# Patient Record
Sex: Female | Born: 1975 | Race: White | Hispanic: No | Marital: Married | Smoking: Current every day smoker
Health system: Southern US, Community
[De-identification: ages and names within clinical notes are randomized; demographics above are authoritative.]

## PROBLEM LIST (undated history)

## (undated) DIAGNOSIS — D693 Immune thrombocytopenic purpura: Secondary | ICD-10-CM

## (undated) HISTORY — PX: LEG SURGERY: SHX1003

---

## 2006-01-08 DIAGNOSIS — F32A Depression, unspecified: Secondary | ICD-10-CM | POA: Insufficient documentation

## 2006-01-08 HISTORY — DX: Depression, unspecified: F32.A

## 2006-10-09 DIAGNOSIS — I472 Ventricular tachycardia, unspecified: Secondary | ICD-10-CM | POA: Insufficient documentation

## 2006-10-09 HISTORY — DX: Ventricular tachycardia, unspecified: I47.20

## 2006-11-03 DIAGNOSIS — R519 Headache, unspecified: Secondary | ICD-10-CM

## 2006-11-03 HISTORY — DX: Headache, unspecified: R51.9

## 2006-12-01 DIAGNOSIS — M109 Gout, unspecified: Secondary | ICD-10-CM

## 2006-12-01 HISTORY — DX: Gout, unspecified: M10.9

## 2008-01-14 DIAGNOSIS — E041 Nontoxic single thyroid nodule: Secondary | ICD-10-CM | POA: Insufficient documentation

## 2008-01-14 HISTORY — DX: Nontoxic single thyroid nodule: E04.1

## 2010-12-18 DIAGNOSIS — E039 Hypothyroidism, unspecified: Secondary | ICD-10-CM | POA: Insufficient documentation

## 2010-12-18 HISTORY — DX: Hypothyroidism, unspecified: E03.9

## 2011-03-15 DIAGNOSIS — R0600 Dyspnea, unspecified: Secondary | ICD-10-CM

## 2011-03-15 DIAGNOSIS — D72829 Elevated white blood cell count, unspecified: Secondary | ICD-10-CM

## 2011-03-15 HISTORY — DX: Dyspnea, unspecified: R06.00

## 2011-03-15 HISTORY — DX: Elevated white blood cell count, unspecified: D72.829

## 2011-03-18 DIAGNOSIS — L709 Acne, unspecified: Secondary | ICD-10-CM

## 2011-03-18 HISTORY — DX: Acne, unspecified: L70.9

## 2016-01-16 DIAGNOSIS — E890 Postprocedural hypothyroidism: Secondary | ICD-10-CM | POA: Diagnosis not present

## 2016-06-05 DIAGNOSIS — E7800 Pure hypercholesterolemia, unspecified: Secondary | ICD-10-CM | POA: Diagnosis not present

## 2016-06-05 DIAGNOSIS — R300 Dysuria: Secondary | ICD-10-CM | POA: Diagnosis not present

## 2016-06-05 DIAGNOSIS — I10 Essential (primary) hypertension: Secondary | ICD-10-CM | POA: Diagnosis not present

## 2016-06-05 DIAGNOSIS — N390 Urinary tract infection, site not specified: Secondary | ICD-10-CM | POA: Diagnosis not present

## 2016-06-05 DIAGNOSIS — E119 Type 2 diabetes mellitus without complications: Secondary | ICD-10-CM | POA: Diagnosis not present

## 2016-06-05 DIAGNOSIS — E039 Hypothyroidism, unspecified: Secondary | ICD-10-CM | POA: Diagnosis not present

## 2016-06-07 ENCOUNTER — Encounter (HOSPITAL_COMMUNITY): Payer: Self-pay | Admitting: Emergency Medicine

## 2016-06-07 ENCOUNTER — Emergency Department (HOSPITAL_COMMUNITY)
Admission: EM | Admit: 2016-06-07 | Discharge: 2016-06-07 | Disposition: A | Discharge status: Home / Self Care | Payer: BLUE CROSS/BLUE SHIELD | Attending: Emergency Medicine | Admitting: Emergency Medicine

## 2016-06-07 DIAGNOSIS — F17200 Nicotine dependence, unspecified, uncomplicated: Secondary | ICD-10-CM | POA: Insufficient documentation

## 2016-06-07 DIAGNOSIS — D696 Thrombocytopenia, unspecified: Secondary | ICD-10-CM | POA: Insufficient documentation

## 2016-06-07 DIAGNOSIS — N3000 Acute cystitis without hematuria: Secondary | ICD-10-CM | POA: Diagnosis not present

## 2016-06-07 DIAGNOSIS — R350 Frequency of micturition: Secondary | ICD-10-CM | POA: Diagnosis present

## 2016-06-07 HISTORY — DX: Immune thrombocytopenic purpura: D69.3

## 2016-06-07 LAB — URINE MICROSCOPIC-ADD ON

## 2016-06-07 LAB — URINALYSIS, ROUTINE W REFLEX MICROSCOPIC
BILIRUBIN URINE: NEGATIVE
GLUCOSE, UA: NEGATIVE mg/dL
Ketones, ur: NEGATIVE mg/dL
Leukocytes, UA: NEGATIVE
Nitrite: NEGATIVE
PROTEIN: NEGATIVE mg/dL
pH: 6.5 (ref 5.0–8.0)

## 2016-06-07 LAB — CBC WITH DIFFERENTIAL/PLATELET
BASOS PCT: 1 %
Basophils Absolute: 0.1 10*3/uL (ref 0.0–0.1)
EOS ABS: 0.3 10*3/uL (ref 0.0–0.7)
EOS PCT: 2 %
HCT: 42.7 % (ref 36.0–46.0)
Hemoglobin: 13.9 g/dL (ref 12.0–15.0)
Lymphocytes Relative: 27 %
Lymphs Abs: 5.6 10*3/uL — ABNORMAL HIGH (ref 0.7–4.0)
MCH: 28.7 pg (ref 26.0–34.0)
MCHC: 32.6 g/dL (ref 30.0–36.0)
MCV: 88 fL (ref 78.0–100.0)
MONO ABS: 1 10*3/uL (ref 0.1–1.0)
MONOS PCT: 5 %
Neutro Abs: 13.7 10*3/uL — ABNORMAL HIGH (ref 1.7–7.7)
Neutrophils Relative %: 66 %
Platelets: 59 10*3/uL — ABNORMAL LOW (ref 150–400)
RBC: 4.85 MIL/uL (ref 3.87–5.11)
RDW: 15 % (ref 11.5–15.5)
WBC: 20.8 10*3/uL — ABNORMAL HIGH (ref 4.0–10.5)

## 2016-06-07 LAB — COMPREHENSIVE METABOLIC PANEL
ALK PHOS: 81 U/L (ref 38–126)
ALT: 18 U/L (ref 14–54)
AST: 28 U/L (ref 15–41)
Albumin: 3.8 g/dL (ref 3.5–5.0)
Anion gap: 11 (ref 5–15)
CALCIUM: 8.7 mg/dL — AB (ref 8.9–10.3)
CHLORIDE: 102 mmol/L (ref 101–111)
CO2: 23 mmol/L (ref 22–32)
CREATININE: 0.98 mg/dL (ref 0.44–1.00)
Glucose, Bld: 165 mg/dL — ABNORMAL HIGH (ref 65–99)
Potassium: 4 mmol/L (ref 3.5–5.1)
SODIUM: 136 mmol/L (ref 135–145)
Total Bilirubin: 0.7 mg/dL (ref 0.3–1.2)
Total Protein: 6.9 g/dL (ref 6.5–8.1)

## 2016-06-07 LAB — PREGNANCY, URINE: PREG TEST UR: NEGATIVE

## 2016-06-07 LAB — TSH: TSH: 9.973 u[IU]/mL — ABNORMAL HIGH (ref 0.350–4.500)

## 2016-06-07 LAB — T4, FREE: Free T4: 1.02 ng/dL (ref 0.61–1.12)

## 2016-06-07 MED ORDER — IBUPROFEN 400 MG PO TABS: 400.0000 mg | ORAL_TABLET | Freq: Once | ORAL | Status: DC

## 2016-06-07 MED ORDER — IBUPROFEN 400 MG PO TABS
ORAL_TABLET | ORAL | Status: AC
  Filled 2016-06-07: qty 1

## 2016-06-07 MED ORDER — CEPHALEXIN 500 MG PO CAPS: 500.0000 mg | ORAL_CAPSULE | Freq: Two times a day (BID) | ORAL | 0 refills | Status: DC

## 2016-06-07 MED ORDER — CEPHALEXIN 500 MG PO CAPS: 500.0000 mg | ORAL_CAPSULE | Freq: Two times a day (BID) | ORAL | 0 refills | Status: AC

## 2016-06-07 NOTE — ED Triage Notes (Signed)
Pt presents to ED for assessment of several abdnormal labs called from a "regular checkup" appointment with her PCP.  Pt was notified her platelets were "very low" with a history of ITP, spleen removal, and thyroid removal.  Pt also had "off the charts high" thyroid levels.  C/o increased fatigue recently, but denies any other noticeable changes.

## 2016-06-07 NOTE — ED Provider Notes (Signed)
[facility]-EMERGENCY DEPT Provider Note  CSN: [id number] Arrival Date & Time: 06/07/16 @ 1159  History    Chief Complaint Chief Complaint  Patient presents with  . Abnormal Lab    HPI Latoya Ayers is a 40 y.o. female.  Patient presents the ED for concern of low platelet count upon lab collection on Wednesday due to urinary discomfort and increased urinary frequency. Was diagnosed w/ UTI and given rocephin shot and course bactrim and is on the 3rd day. Denies hematuria but endorses continued urinary discomfort.  Patient endorses she had period 2 weeks previous and had heavy bleeding however endorses that she believe it may be related to kratrum herbal supplement that she had been taking for the past 2 months for fatigue over the past 4 months.  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  Past Medical & Surgical History    Past Medical History:  Diagnosis Date  . Acute ITP (HCC)    There are no active problems to display for this patient.  Past Surgical History:  Procedure Laterality Date  . LEG SURGERY      Family & Social History    History reviewed. No pertinent family history. Social History  Substance Use Topics  . Smoking status: Current Every Day Smoker    Packs/day: 1.00  . Smokeless tobacco: Never Used  . Alcohol use Yes     Comment: socially    Home Medications    Prior to Admission medications   Not on File    Allergies    Penicillins  I reviewed & agree with nursing's documentation on the patient's past medical, surgical, social & family histories as well as their allergies.  Review of Systems  Complete ROS obtained, and is negative except as stated in HPI.  Physical Exam  Updated Vital Signs BP 114/63 (BP Location: Left Arm)   Pulse 76   Temp 98.4 F (36.9 C) (Oral)   Resp 16   Ht 5\' 4"  (1.626 m)   Wt 127 kg   LMP 05/24/2016   SpO2 99%   BMI 48.06 kg/m  I have reviewed the triage  vital signs and the nursing notes. Physical Exam CONST: Patient alert, well appearing, in no apparent distress.  EYES: PERRLA. EOMI. Conjunctiva w/o d/c. Lids AT w/o swelling.  ENMT: External Nares & Ears AT w/o swelling. Oropharynx patent. MM moist w/o petechiae.  NECK: ROM full w/o rigidity. Trachea midline. JVD absent.  CVS: +S1/S2 w/o obvious murmur. Lower extremities w/o pitting edema.  RESP: Respiratory effort unlabored w/o retractions & accessory muscle use. BS clear bilaterally.  GI: Soft & ND. +BS x 4. TTP absent. Hernia absent. Guarding & Rebound absent.  BACK: CVA TTP absent bilaterally.  SKIN: Skin warm & dry. Turgor good. No rash. No petechiae or purpura.  PSYCH: Alert. Oriented. Affect and mood appropriate.  NEURO: CN II-XII grossly intact. Motor exam symmetric w/ upper & lower extremities 5/5 bilaterally. Sensation grossly intact.  MSK: Joints located & stable, w/o obvious dislocation & obvious deformity or crepitus absent w/ Cap refill < 2 sec. Peripheral pulses 2+ & equal in all extremities.    ED Treatments & Results   Labs (only abnormal results are displayed) Labs Reviewed  CBC WITH DIFFERENTIAL/PLATELET - Abnormal; Notable for the following:       Result Value   WBC 20.8 (*)    Platelets 59 (*)    Neutro Abs 13.7 (*)  Lymphs Abs 5.6 (*)    All other components within normal limits  COMPREHENSIVE METABOLIC PANEL - Abnormal; Notable for the following:    Glucose, Bld 165 (*)    BUN <5 (*)    Calcium 8.7 (*)    All other components within normal limits    EKG    EKG Interpretation  Date/Time:    Ventricular Rate:    PR Interval:    QRS Duration:   QT Interval:    QTC Calculation:   R Axis:     Text Interpretation:         Radiology No results found.  Pertinent labs & imaging results that were available during my care of the patient were independently visualized by me and considered in my medical decision making, please see chart for  details.  Procedures (including critical care time) Procedures  Medications Ordered in ED Medications  ibuprofen (ADVIL,MOTRIN) tablet 400 mg (400 mg Oral Not Given 06/07/16 1234)  ibuprofen (ADVIL,MOTRIN) 400 MG tablet (not administered)    Initial Impression & Plan / ED Course & Results / Final Disposition   Initial Impression & Plan Patient presents to emergency department for assessment of low platelets and continued urinary discomfort. Patient is well-appearing and nontoxic afebrile and has no criteria for sepsis at this time and also denies flank pain that would concern for pyelonephritis therefore I obtained a repeat urine and laboratory work and obtained consultation from hematology oncology and discussed patient's previous history of ITP and platelets of 59. Per discussion with them they request no further treatment at this time and requests patient be seen in the clinic in the next 1-2 weeks. They state she requires no steroid treatment at this time. As patient has no signs of bleeding and laboratory work reveals no other concerning signs of hematologic compromise and urine shows no evidence of microscopic bleeding believe patient is safe for evaluation of low platelets as outpatient. Given patient still has continued urination symptoms and urine remains with bacteria will transfer patient to 10 day course of cephalexin. Urine pregnancy test negative. Due to patient's endorsement of previously elevated TSH I obtained a repeat levels at this time and appreciate the patient is significantly hypothyroid and levels of TSH are 9.973 with normal T4. Discussion had with patient the patient requires close primary care follow-up for titration of levothyroxine as this is unsafe for me to do so at this time. Patient has no altered mental status and no symptoms concerning for Myedema coma.   Final Disposition Reassessment of the patient reveals no acute concerns. Resting comfortably in bed.  ED  Course in its entirety, care plan & clinical impressions w/ associated risks were reviewed w/ the patient and spouse/SO. In light of the patient's reassuring evaluation above, I consider discharge disposition reasonable. They are in agreement. I gave my typical, strict return precautions in simple, non-technical language. We also discussed symptoms that are most concerning & would necessitate emergent return. I explicitly told them to immediately return to the ED if new symptoms develop, if worse, or for ANY concern. Treatments & follow up plan agreed upon & I confirmed all concerns & questions were addressed prior to discharge.  Follow Up [facility] EMERGENCY DEPARTMENT [address] [id number] mc [place] [address] [phone] Go to  FOR ANY CONCERNS OR IF [facility] AND WELLNESS [address] [place] [address] [phone] Go in 2 days to establish Primary Care  Malachy MoodYan Feng, MD [address] [place] [id number] [phone]  Schedule an appointment as soon as possible for a visit in 1 day for hematology follow up  [facility] [address] [id number] mc [place] [id number] [phone] Go to  for any bleeding symptoms return immediately   New Prescriptions Current Discharge Medication List    START taking these medications   Details  cephALEXin (KEFLEX) 500 MG capsule Take 1 capsule (500 mg total) by mouth 2 (two) times daily. Qty: 20 capsule, Refills: 0       Final Clinical Impression & ED Diagnoses  No diagnosis found. Patient care discussed with the attending physician, Dr. Patria Maneampos, who oversaw their evaluation & treatment & voiced agreement.  Note: This document was prepared using Dragon voice recognition software and may include unintentional dictation errors.  House Officer: Jonette EvaBrad Asami Lambright, MD, Emergency Medicine  Resident.   Jonette EvaBrad Omarie Parcell, MD 06/07/16 2117    Azalia BilisKevin Campos, MD 06/08/16 [phone]

## 2016-06-07 NOTE — ED Notes (Signed)
Urine sample sent to lab

## 2016-06-07 NOTE — ED Notes (Signed)
EDP at bedside  

## 2016-06-08 LAB — URINE CULTURE

## 2016-06-10 DIAGNOSIS — R799 Abnormal finding of blood chemistry, unspecified: Secondary | ICD-10-CM | POA: Diagnosis not present

## 2016-06-10 DIAGNOSIS — N939 Abnormal uterine and vaginal bleeding, unspecified: Secondary | ICD-10-CM | POA: Diagnosis not present

## 2016-06-10 DIAGNOSIS — D693 Immune thrombocytopenic purpura: Secondary | ICD-10-CM | POA: Diagnosis not present

## 2016-06-10 DIAGNOSIS — D696 Thrombocytopenia, unspecified: Secondary | ICD-10-CM | POA: Diagnosis not present

## 2016-06-10 DIAGNOSIS — F17210 Nicotine dependence, cigarettes, uncomplicated: Secondary | ICD-10-CM | POA: Diagnosis not present

## 2016-06-10 DIAGNOSIS — R21 Rash and other nonspecific skin eruption: Secondary | ICD-10-CM | POA: Diagnosis not present

## 2016-06-10 DIAGNOSIS — Z6841 Body Mass Index (BMI) 40.0 and over, adult: Secondary | ICD-10-CM | POA: Diagnosis not present

## 2016-06-25 DIAGNOSIS — R5383 Other fatigue: Secondary | ICD-10-CM | POA: Diagnosis not present

## 2016-06-25 DIAGNOSIS — E669 Obesity, unspecified: Secondary | ICD-10-CM | POA: Diagnosis not present

## 2016-06-25 DIAGNOSIS — R109 Unspecified abdominal pain: Secondary | ICD-10-CM | POA: Diagnosis not present

## 2016-06-25 DIAGNOSIS — E039 Hypothyroidism, unspecified: Secondary | ICD-10-CM | POA: Diagnosis not present

## 2016-06-25 DIAGNOSIS — F17200 Nicotine dependence, unspecified, uncomplicated: Secondary | ICD-10-CM | POA: Diagnosis not present

## 2016-06-25 DIAGNOSIS — Z720 Tobacco use: Secondary | ICD-10-CM | POA: Diagnosis not present

## 2016-06-25 DIAGNOSIS — G4730 Sleep apnea, unspecified: Secondary | ICD-10-CM | POA: Diagnosis not present

## 2016-07-03 DIAGNOSIS — R079 Chest pain, unspecified: Secondary | ICD-10-CM | POA: Diagnosis not present

## 2016-07-03 DIAGNOSIS — E039 Hypothyroidism, unspecified: Secondary | ICD-10-CM | POA: Diagnosis not present

## 2016-07-03 DIAGNOSIS — D693 Immune thrombocytopenic purpura: Secondary | ICD-10-CM | POA: Diagnosis not present

## 2016-07-03 DIAGNOSIS — R42 Dizziness and giddiness: Secondary | ICD-10-CM | POA: Diagnosis not present

## 2016-07-03 DIAGNOSIS — Z136 Encounter for screening for cardiovascular disorders: Secondary | ICD-10-CM | POA: Diagnosis not present

## 2016-07-03 DIAGNOSIS — R0600 Dyspnea, unspecified: Secondary | ICD-10-CM | POA: Diagnosis not present

## 2016-07-03 DIAGNOSIS — R011 Cardiac murmur, unspecified: Secondary | ICD-10-CM | POA: Diagnosis not present

## 2016-07-16 DIAGNOSIS — D693 Immune thrombocytopenic purpura: Secondary | ICD-10-CM | POA: Insufficient documentation

## 2016-07-18 DIAGNOSIS — R51 Headache: Secondary | ICD-10-CM | POA: Diagnosis not present

## 2016-07-18 DIAGNOSIS — D691 Qualitative platelet defects: Secondary | ICD-10-CM | POA: Diagnosis not present

## 2016-07-18 DIAGNOSIS — Z85850 Personal history of malignant neoplasm of thyroid: Secondary | ICD-10-CM | POA: Diagnosis not present

## 2016-07-18 DIAGNOSIS — E890 Postprocedural hypothyroidism: Secondary | ICD-10-CM | POA: Diagnosis not present

## 2016-07-18 DIAGNOSIS — D721 Eosinophilia: Secondary | ICD-10-CM | POA: Diagnosis not present

## 2016-07-18 DIAGNOSIS — G8929 Other chronic pain: Secondary | ICD-10-CM | POA: Diagnosis not present

## 2016-07-18 DIAGNOSIS — F17210 Nicotine dependence, cigarettes, uncomplicated: Secondary | ICD-10-CM | POA: Diagnosis not present

## 2016-07-18 DIAGNOSIS — D696 Thrombocytopenia, unspecified: Secondary | ICD-10-CM | POA: Diagnosis not present

## 2016-07-18 DIAGNOSIS — E039 Hypothyroidism, unspecified: Secondary | ICD-10-CM | POA: Diagnosis not present

## 2016-07-18 DIAGNOSIS — Z6841 Body Mass Index (BMI) 40.0 and over, adult: Secondary | ICD-10-CM | POA: Diagnosis not present

## 2016-07-18 DIAGNOSIS — Z23 Encounter for immunization: Secondary | ICD-10-CM | POA: Diagnosis not present

## 2016-07-18 DIAGNOSIS — Z79899 Other long term (current) drug therapy: Secondary | ICD-10-CM | POA: Diagnosis not present

## 2016-07-31 DIAGNOSIS — L509 Urticaria, unspecified: Secondary | ICD-10-CM | POA: Diagnosis not present

## 2016-07-31 DIAGNOSIS — D696 Thrombocytopenia, unspecified: Secondary | ICD-10-CM | POA: Diagnosis not present

## 2016-08-07 DIAGNOSIS — D696 Thrombocytopenia, unspecified: Secondary | ICD-10-CM | POA: Diagnosis not present

## 2016-08-07 DIAGNOSIS — C73 Malignant neoplasm of thyroid gland: Secondary | ICD-10-CM

## 2016-08-07 DIAGNOSIS — D72829 Elevated white blood cell count, unspecified: Secondary | ICD-10-CM | POA: Diagnosis not present

## 2016-08-07 DIAGNOSIS — Z6841 Body Mass Index (BMI) 40.0 and over, adult: Secondary | ICD-10-CM | POA: Diagnosis not present

## 2016-08-07 HISTORY — DX: Malignant neoplasm of thyroid gland: C73

## 2016-10-10 DIAGNOSIS — R0600 Dyspnea, unspecified: Secondary | ICD-10-CM | POA: Diagnosis not present

## 2016-10-10 DIAGNOSIS — E7800 Pure hypercholesterolemia, unspecified: Secondary | ICD-10-CM | POA: Diagnosis not present

## 2016-10-10 DIAGNOSIS — R5383 Other fatigue: Secondary | ICD-10-CM | POA: Diagnosis not present

## 2016-10-10 DIAGNOSIS — E559 Vitamin D deficiency, unspecified: Secondary | ICD-10-CM | POA: Diagnosis not present

## 2016-10-10 DIAGNOSIS — F17200 Nicotine dependence, unspecified, uncomplicated: Secondary | ICD-10-CM | POA: Diagnosis not present

## 2016-10-10 DIAGNOSIS — D693 Immune thrombocytopenic purpura: Secondary | ICD-10-CM | POA: Diagnosis not present

## 2016-10-10 DIAGNOSIS — E039 Hypothyroidism, unspecified: Secondary | ICD-10-CM | POA: Diagnosis not present

## 2016-10-10 DIAGNOSIS — R42 Dizziness and giddiness: Secondary | ICD-10-CM | POA: Diagnosis not present

## 2017-01-09 DIAGNOSIS — E039 Hypothyroidism, unspecified: Secondary | ICD-10-CM | POA: Diagnosis not present

## 2017-01-09 DIAGNOSIS — R42 Dizziness and giddiness: Secondary | ICD-10-CM | POA: Diagnosis not present

## 2017-01-09 DIAGNOSIS — D693 Immune thrombocytopenic purpura: Secondary | ICD-10-CM | POA: Diagnosis not present

## 2017-01-09 DIAGNOSIS — E7800 Pure hypercholesterolemia, unspecified: Secondary | ICD-10-CM | POA: Diagnosis not present

## 2017-01-09 DIAGNOSIS — R0989 Other specified symptoms and signs involving the circulatory and respiratory systems: Secondary | ICD-10-CM | POA: Diagnosis not present

## 2017-01-09 DIAGNOSIS — Z22322 Carrier or suspected carrier of Methicillin resistant Staphylococcus aureus: Secondary | ICD-10-CM | POA: Diagnosis not present

## 2017-01-09 DIAGNOSIS — R0600 Dyspnea, unspecified: Secondary | ICD-10-CM | POA: Diagnosis not present

## 2017-01-09 DIAGNOSIS — F17200 Nicotine dependence, unspecified, uncomplicated: Secondary | ICD-10-CM | POA: Diagnosis not present

## 2017-01-09 DIAGNOSIS — Z87891 Personal history of nicotine dependence: Secondary | ICD-10-CM | POA: Diagnosis not present

## 2017-01-09 DIAGNOSIS — R55 Syncope and collapse: Secondary | ICD-10-CM | POA: Diagnosis not present

## 2017-01-09 DIAGNOSIS — L0390 Cellulitis, unspecified: Secondary | ICD-10-CM | POA: Diagnosis not present

## 2017-01-09 DIAGNOSIS — R5383 Other fatigue: Secondary | ICD-10-CM | POA: Diagnosis not present

## 2017-01-09 DIAGNOSIS — E559 Vitamin D deficiency, unspecified: Secondary | ICD-10-CM | POA: Diagnosis not present

## 2017-01-21 DIAGNOSIS — D693 Immune thrombocytopenic purpura: Secondary | ICD-10-CM | POA: Diagnosis not present

## 2017-02-07 DIAGNOSIS — D693 Immune thrombocytopenic purpura: Secondary | ICD-10-CM | POA: Diagnosis not present

## 2017-02-26 DIAGNOSIS — N390 Urinary tract infection, site not specified: Secondary | ICD-10-CM | POA: Diagnosis not present

## 2017-02-26 DIAGNOSIS — R42 Dizziness and giddiness: Secondary | ICD-10-CM | POA: Diagnosis not present

## 2017-02-26 DIAGNOSIS — E039 Hypothyroidism, unspecified: Secondary | ICD-10-CM | POA: Diagnosis not present

## 2017-02-26 DIAGNOSIS — D693 Immune thrombocytopenic purpura: Secondary | ICD-10-CM | POA: Diagnosis not present

## 2017-02-26 DIAGNOSIS — I340 Nonrheumatic mitral (valve) insufficiency: Secondary | ICD-10-CM | POA: Diagnosis not present

## 2017-02-26 DIAGNOSIS — R079 Chest pain, unspecified: Secondary | ICD-10-CM | POA: Diagnosis not present

## 2017-04-18 DIAGNOSIS — L821 Other seborrheic keratosis: Secondary | ICD-10-CM | POA: Diagnosis not present

## 2017-04-18 DIAGNOSIS — L700 Acne vulgaris: Secondary | ICD-10-CM | POA: Diagnosis not present

## 2017-04-18 DIAGNOSIS — L732 Hidradenitis suppurativa: Secondary | ICD-10-CM | POA: Diagnosis not present

## 2017-09-29 DIAGNOSIS — D1801 Hemangioma of skin and subcutaneous tissue: Secondary | ICD-10-CM | POA: Diagnosis not present

## 2017-09-29 DIAGNOSIS — L578 Other skin changes due to chronic exposure to nonionizing radiation: Secondary | ICD-10-CM | POA: Diagnosis not present

## 2017-09-29 DIAGNOSIS — L700 Acne vulgaris: Secondary | ICD-10-CM | POA: Diagnosis not present

## 2017-10-23 DIAGNOSIS — E7800 Pure hypercholesterolemia, unspecified: Secondary | ICD-10-CM | POA: Diagnosis not present

## 2017-10-23 DIAGNOSIS — E039 Hypothyroidism, unspecified: Secondary | ICD-10-CM | POA: Diagnosis not present

## 2017-10-23 DIAGNOSIS — E669 Obesity, unspecified: Secondary | ICD-10-CM | POA: Diagnosis not present

## 2017-10-23 DIAGNOSIS — R5383 Other fatigue: Secondary | ICD-10-CM | POA: Diagnosis not present

## 2017-10-23 DIAGNOSIS — E559 Vitamin D deficiency, unspecified: Secondary | ICD-10-CM | POA: Diagnosis not present

## 2017-12-04 DIAGNOSIS — Z9049 Acquired absence of other specified parts of digestive tract: Secondary | ICD-10-CM | POA: Diagnosis not present

## 2017-12-04 DIAGNOSIS — F1111 Opioid abuse, in remission: Secondary | ICD-10-CM

## 2017-12-04 DIAGNOSIS — Z880 Allergy status to penicillin: Secondary | ICD-10-CM | POA: Diagnosis not present

## 2017-12-04 DIAGNOSIS — J069 Acute upper respiratory infection, unspecified: Secondary | ICD-10-CM | POA: Diagnosis not present

## 2017-12-04 DIAGNOSIS — D693 Immune thrombocytopenic purpura: Secondary | ICD-10-CM | POA: Diagnosis not present

## 2017-12-04 DIAGNOSIS — E890 Postprocedural hypothyroidism: Secondary | ICD-10-CM | POA: Diagnosis not present

## 2017-12-04 DIAGNOSIS — D72829 Elevated white blood cell count, unspecified: Secondary | ICD-10-CM | POA: Diagnosis not present

## 2017-12-04 DIAGNOSIS — E663 Overweight: Secondary | ICD-10-CM | POA: Diagnosis not present

## 2017-12-04 DIAGNOSIS — F17210 Nicotine dependence, cigarettes, uncomplicated: Secondary | ICD-10-CM | POA: Diagnosis not present

## 2017-12-04 DIAGNOSIS — Z79899 Other long term (current) drug therapy: Secondary | ICD-10-CM | POA: Diagnosis not present

## 2017-12-04 DIAGNOSIS — Z87898 Personal history of other specified conditions: Secondary | ICD-10-CM | POA: Diagnosis not present

## 2017-12-04 DIAGNOSIS — N920 Excessive and frequent menstruation with regular cycle: Secondary | ICD-10-CM | POA: Diagnosis not present

## 2017-12-04 HISTORY — DX: Opioid abuse, in remission: F11.11

## 2018-10-29 DIAGNOSIS — J111 Influenza due to unidentified influenza virus with other respiratory manifestations: Secondary | ICD-10-CM | POA: Insufficient documentation

## 2019-09-09 DIAGNOSIS — E039 Hypothyroidism, unspecified: Secondary | ICD-10-CM | POA: Diagnosis not present

## 2019-09-09 DIAGNOSIS — F329 Major depressive disorder, single episode, unspecified: Secondary | ICD-10-CM | POA: Diagnosis not present

## 2019-09-09 DIAGNOSIS — Z1331 Encounter for screening for depression: Secondary | ICD-10-CM | POA: Diagnosis not present

## 2019-09-09 DIAGNOSIS — Z23 Encounter for immunization: Secondary | ICD-10-CM | POA: Diagnosis not present

## 2019-09-09 DIAGNOSIS — Z6841 Body Mass Index (BMI) 40.0 and over, adult: Secondary | ICD-10-CM | POA: Diagnosis not present

## 2019-12-09 DIAGNOSIS — M2550 Pain in unspecified joint: Secondary | ICD-10-CM | POA: Diagnosis not present

## 2019-12-09 DIAGNOSIS — E039 Hypothyroidism, unspecified: Secondary | ICD-10-CM | POA: Diagnosis not present

## 2019-12-09 DIAGNOSIS — R5383 Other fatigue: Secondary | ICD-10-CM | POA: Diagnosis not present

## 2019-12-09 DIAGNOSIS — M791 Myalgia, unspecified site: Secondary | ICD-10-CM | POA: Diagnosis not present

## 2019-12-14 DIAGNOSIS — G4733 Obstructive sleep apnea (adult) (pediatric): Secondary | ICD-10-CM | POA: Diagnosis not present

## 2019-12-14 DIAGNOSIS — R0602 Shortness of breath: Secondary | ICD-10-CM | POA: Diagnosis not present

## 2019-12-15 DIAGNOSIS — R0602 Shortness of breath: Secondary | ICD-10-CM | POA: Diagnosis not present

## 2019-12-15 DIAGNOSIS — G4733 Obstructive sleep apnea (adult) (pediatric): Secondary | ICD-10-CM | POA: Diagnosis not present

## 2020-06-12 DIAGNOSIS — F1120 Opioid dependence, uncomplicated: Secondary | ICD-10-CM | POA: Diagnosis not present

## 2020-06-12 DIAGNOSIS — F1920 Other psychoactive substance dependence, uncomplicated: Secondary | ICD-10-CM | POA: Diagnosis not present

## 2020-06-12 DIAGNOSIS — F19980 Other psychoactive substance use, unspecified with psychoactive substance-induced anxiety disorder: Secondary | ICD-10-CM | POA: Diagnosis not present

## 2020-06-26 DIAGNOSIS — F1920 Other psychoactive substance dependence, uncomplicated: Secondary | ICD-10-CM | POA: Diagnosis not present

## 2020-06-26 DIAGNOSIS — F1120 Opioid dependence, uncomplicated: Secondary | ICD-10-CM | POA: Diagnosis not present

## 2020-06-26 DIAGNOSIS — F19980 Other psychoactive substance use, unspecified with psychoactive substance-induced anxiety disorder: Secondary | ICD-10-CM | POA: Diagnosis not present

## 2020-07-10 DIAGNOSIS — F19980 Other psychoactive substance use, unspecified with psychoactive substance-induced anxiety disorder: Secondary | ICD-10-CM | POA: Diagnosis not present

## 2020-07-10 DIAGNOSIS — F1920 Other psychoactive substance dependence, uncomplicated: Secondary | ICD-10-CM | POA: Diagnosis not present

## 2020-07-10 DIAGNOSIS — F1120 Opioid dependence, uncomplicated: Secondary | ICD-10-CM | POA: Diagnosis not present

## 2020-07-24 DIAGNOSIS — F1120 Opioid dependence, uncomplicated: Secondary | ICD-10-CM | POA: Diagnosis not present

## 2020-07-24 DIAGNOSIS — F1920 Other psychoactive substance dependence, uncomplicated: Secondary | ICD-10-CM | POA: Diagnosis not present

## 2020-07-24 DIAGNOSIS — F19980 Other psychoactive substance use, unspecified with psychoactive substance-induced anxiety disorder: Secondary | ICD-10-CM | POA: Diagnosis not present

## 2020-10-17 ENCOUNTER — Telehealth: Payer: Self-pay | Admitting: Hematology

## 2020-10-17 NOTE — Telephone Encounter (Signed)
Received a call from Ms. Kirt who is wanting to transfer her care from [facility] for chronic ITP. Pt has been scheduled to see Dr. Candise Che on 2/21 at 11am. Pt aware to arrive 30 minutes early.

## 2020-10-22 NOTE — Progress Notes (Signed)
HEMATOLOGY/ONCOLOGY CONSULTATION NOTE  Date of Service: 10/22/2020  Patient Care Team: Patient, No Pcp Per as PCP - General (General Practice) Dr. Simone Curia - [facility]  CHIEF COMPLAINTS/PURPOSE OF CONSULTATION:  Chronic ITP  HISTORY OF PRESENTING ILLNESS:   Latoya Ayers is a wonderful 45 y.o. female who has been referred to [place] by [facility] for transfer of care and for evaluation and management of chronic ITP.   Patient has a history of chronic ITP. She was diagnosed in 2008 and responded to steroids but relapsed with a taper. 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  Patient relapsed for the first time in late 2017 with platelets down to the 30k range and responded with improvement in platelet counts to more than 200k with dexamethasone. She has apparently had multiple treatments with dexamethasone in the last few years without durable response.  She underwent treatment with 4 doses of Rituxan from 3/18 through 12/07/2018 and was in remission for her chronic ITP till recently.  Patient presented to the emergency room at [facility] on 10/06/[facility] [place] with 2 days of heavy menstrual vaginal bleeding, headaches, diffuse body aches,  fevers, night sweats and petechiae in the skin and oropharynx.  She was diagnosed with Covid infection in an unvaccinated situation.  Her platelet counts were noted to be 0. She was treated with Lysteda and dexamethasone 40 mg p.o. daily for 4 days. Her platelet counts after steroid dose levels were noted to be more than 100k. She did receive 1 unit of platelets. She did not receive IVIG. Hemoglobin levels were stable at 12.5.  Her chronic ITP relapse was thought to be related to active Covid infection. She did not receive any specific Covid related therapeutics. She did have a CT of the head which showed no intracranial bleeding.  She has self-referred  to be seen by [place] for continued management of her chronic ITP. She notes she was very anxious since her platelets dropped to 0 and there was concern for significant bleeding.  She notes that in the last few days she has been taking 20 mg of dexamethasone daily because she was anxious her platelets might have dropped again.  On review of systems, pt reports no further fevers chills or night sweats. No shortness of breath. Resolution of all her Covid symptoms. No new bleeding or bruising. No current vaginal bleeding. No epistaxis hemoptysis hematemesis or GI bleeding. No current headaches.   MEDICAL HISTORY:  Past Medical History:  Diagnosis Date  . 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 Patient notes follicular adenoma removed in 2009. She has been on chronic levothyroxine replacement but her recent TSH was noted to be low at 0.068 and her levothyroxine dose was reduced to 150 mcg daily with a plan to follow-up with her primary care physician.  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 Notes that she is up-to-date with her postsplenectomy vaccinations. Chronic smoker.   SURGICAL HISTORY:  Past Surgical History:  Procedure Laterality Date  . LEG SURGERY    Status post splenectomy- for chronic ITP Status post thyroidectomy for follicular adenoma.  SOCIAL HISTORY: Social History   Socioeconomic History  . Marital status: Married    Spouse name: Not on file  . Number of children: Not on file  . Years of education: Not on file  . Highest  education level: Not on file  Occupational History  . Not on file  Tobacco Use  . Smoking status: Current Every Day Smoker    Packs/day: 1.00  . Smokeless tobacco: Never Used  Substance and Sexual Activity  . Alcohol use: Yes    Comment: socially  . Drug use: No  . Sexual activity: Not on file  Other Topics Concern  . Not on file  Social History  Narrative  . Not on file   Social Determinants of Health   Financial Resource Strain: Not on file  Food Insecurity: Not on file  Transportation Needs: Not on file  Physical Activity: Not on file  Stress: Not on file  Social Connections: Not on file  Intimate Partner Violence: Not on file  smoking  vaping 20 puffs a day suboxone  FAMILY HISTORY:  Breast cancer -- family hx  ALLERGIES:  is allergic to penicillins.    MEDICATIONS:  Current Outpatient Medications  Medication Sig Dispense Refill  . albuterol (VENTOLIN HFA) 108 (90 Base) MCG/ACT inhaler Inhale 2 puffs into the lungs every 4 (four) hours as needed.    . Buprenorphine HCl-Naloxone HCl 8-2 MG FILM Place under the tongue daily.    . cyanocobalamin 1000 MCG tablet Take by mouth.    . hydrochlorothiazide (MICROZIDE) 12.5 MG capsule Take by mouth.    . levothyroxine (SYNTHROID) 150 MCG tablet Take 150 mcg by mouth.    . Multiple Vitamin (MULTI-VITAMIN) tablet Take 1 tablet by mouth daily.    . phentermine 37.5 MG capsule Take by mouth.    . tranexamic acid (LYSTEDA) 650 MG TABS tablet     . nicotine (NICODERM CQ - DOSED IN MG/24 HOURS) 21 mg/24hr patch Place onto the skin. (Patient not taking: Reported on 10/23/2020)    . sulfamethoxazole-trimethoprim (BACTRIM DS) 800-160 MG tablet Take 1 tablet by mouth daily. (Patient not taking: Reported on 10/23/2020)     No current facility-administered medications for this visit.    REVIEW OF SYSTEMS:   10 Point review of Systems was done is negative except as noted above.  PHYSICAL EXAMINATION: ECOG PERFORMANCE STATUS: 1 - Symptomatic but completely ambulatory  . Vitals:   10/23/20 1100  BP: (!) 139/95  Pulse: 61  Resp: 20  Temp: (!) 97.4 F (36.3 C)  SpO2: 99%   Filed Weights   10/23/20 1100  Weight: 294 lb 11.2 oz (133.7 kg)   .Body mass index is 50.59 kg/m.  NAD GENERAL:alert, in no acute distress and comfortable SKIN: no acute rashes, no current petechiae  or ecchymosis EYES: conjunctiva are pink and non-injected, sclera anicteric OROPHARYNX: MMM, no exudates, no oropharyngeal erythema or ulceration NECK: supple, no JVD LYMPH:  no palpable lymphadenopathy in the cervical, axillary or inguinal regions LUNGS: clear to auscultation b/l with normal respiratory effort HEART: regular rate & rhythm ABDOMEN:  normoactive bowel sounds , non tender, not distended. Extremity: no pedal edema PSYCH: alert & oriented x 3 with fluent speech NEURO: no focal motor/sensory deficits  LABORATORY DATA:  I have reviewed the data as listed  . CBC Latest Ref Rng & Units 10/23/2020 06/07/2016  WBC 4.0 - 10.5 K/uL 22.5(H) 20.8(H)  Hemoglobin 12.0 - 15.0 g/dL 11.3(L) 13.9  Hematocrit 36.0 - 46.0 % 34.8(L) 42.7  Platelets 150 - 400 K/uL 757(H) 59(L)    . CMP Latest Ref Rng & Units 06/07/2016  Glucose 65 - 99 mg/dL 621(H)  BUN 6 - 20 mg/dL <0(Q)  Creatinine 6.57 - 1.00 mg/dL  0.98  Sodium 135 - 145 mmol/L 136  Potassium 3.5 - 5.1 mmol/L 4.0  Chloride 101 - 111 mmol/L 102  CO2 22 - 32 mmol/L 23  Calcium 8.9 - 10.3 mg/dL 5.7(Q8.7(L)  Total Protein 6.5 - 8.1 g/dL 6.9  Total Bilirubin 0.3 - 1.2 mg/dL 0.7  Alkaline Phos 38 - 126 U/L 81  AST 15 - 41 U/L 28  ALT 14 - 54 U/L 18   Component     Latest Ref Rng & Units 10/23/2020  Iron     41 - 142 ug/dL 84  TIBC     469236 - 629444 ug/dL 528405  Saturation Ratios     21 - 57 % 21  UIBC     120 - 384 ug/dL 413321  Preg, Serum     NEGATIVE NEGATIVE  Vitamin B12     180 - 914 pg/mL 646  T4,Free(Direct)     0.61 - 1.12 ng/dL 2.441.11  TSH     0.1020.308 - 7.2533.960 uIU/mL 0.233 (L)  Ferritin     11 - 307 ng/mL 42  Immature Platelet Fraction     1.2 - 8.6 % 1.4    RADIOGRAPHIC STUDIES: I have personally reviewed the radiological images as listed and agreed with the findings in the report. No results found.  ASSESSMENT & PLAN:   45 year old female with history of chronic ITP with details as noted above with  1. Relapse of  chronic ITP due to recent COVID-19 infection on 10/06/2020 with platelet counts of 0. She has had recent induction dose of dexamethasone 40 mg p.o. daily for 4 days. And herself decided to repeat dexamethasone 20 mg daily for additional 3 days until about 1 or 2 days ago.  Platelets today have bounced back to 757k. She has all been able to maintain durable response to steroids in the recent few years.   She was diagnosed in 2008 and responded to steroids but relapsed with a taper. 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  Patient relapsed for the first time in late 2017 with platelets down to the 30k range and responded with improvement in platelet counts to more than 200k with dexamethasone. She has apparently had multiple treatments with dexamethasone in the last few years without durable response.  She underwent treatment with 4 doses of Rituxan from 3/18 through 12/07/2018 and was in remission for her chronic ITP till recently.  PLAN -Called and discussed her lab results with patient. -she wants to continue follow-up here for her chronic ITP with relapse and is concerned about her platelets dropping as they have in the past after initial response to steroids. -We discussed various treatment options including the possibility of repeating Rituxan. She is not keen on this. -She had apparently previously discussed [facility] and prefers to pursue this option if her platelets drop again. -We discussed that at this time if her platelets remain normal she will not need the Nplate. -We will plan to set her up for weekly labs x4 to monitor her platelets and start Nplate if her platelets drop again and she does not show signs of sustained remission.  2. Iron deficiency anemia . Lab Results  Component Value Date   IRON 84 10/23/2020   TIBC 405 10/23/2020   IRONPCTSAT 21 10/23/2020   (Iron and TIBC)  Lab Results  Component  Value Date   FERRITIN 42 10/23/2020   Plan -Continue over-the-counter  ferrous sulfate 325 mg p.o. daily  3. History of B12 deficiency B12 level today is at 646 PLan -Continue B12 1000 mcg p.o. daily  4. Hypothyroidism-status post thyroidectomy in 2009 for follicular adenoma. Plan -On chronic levothyroxine. Dose recently reduced to 150 mcg at [facility] due to elevated TSH levels. Her TSH levels are moving towards normal. -She will continue to follow-up with her primary care physician to continue chronic thyroid replacement.  5. FHX of breast cancer in multiple relatives -We will refer to genetic counseling once her acute ITP relapse has stabilized.  6. History of opiate abuse currently on chronic opiate agonist therapy with Suboxone being managed by her primary care physician.  7. H/o Hidradenitis suppurative  FOLLOW UP: Labs today Plz schedule for weekly Nplate with labs x 4 MD visit in 3 weeks  All of the patients questions were answered with apparent satisfaction. The patient knows to call the clinic with any problems, questions or concerns.  I spent 60 minutes counseling the patient face to face. The total time spent in the appointment was 80 minutes and more than 50% was on counseling and direct patient cares.    Wyvonnia Lora MD MS AAHIVMS [facility] Hematology/Oncology Physician [facility]  (Office):       [phone] (Work cell):  [phone] (Fax):           [phone]  10/22/2020 9:27 AM  I, Minda Meo, am acting as scribe for Dr. Wyvonnia Lora, MD.   .I have reviewed the above documentation for accuracy and completeness, and I agree with the above. Johney Maine MD

## 2020-10-23 ENCOUNTER — Telehealth: Payer: Self-pay | Admitting: Hematology

## 2020-10-23 ENCOUNTER — Inpatient Hospital Stay: Payer: Managed Care, Other (non HMO)

## 2020-10-23 ENCOUNTER — Other Ambulatory Visit: Payer: Self-pay

## 2020-10-23 ENCOUNTER — Other Ambulatory Visit: Payer: Self-pay | Admitting: *Deleted

## 2020-10-23 ENCOUNTER — Inpatient Hospital Stay: Payer: Managed Care, Other (non HMO) | Attending: Hematology | Admitting: Hematology

## 2020-10-23 VITALS — BP 139/95 | HR 61 | Temp 97.4°F | Resp 20 | Ht 64.0 in | Wt 294.7 lb

## 2020-10-23 DIAGNOSIS — D500 Iron deficiency anemia secondary to blood loss (chronic): Secondary | ICD-10-CM

## 2020-10-23 DIAGNOSIS — Z8616 Personal history of COVID-19: Secondary | ICD-10-CM | POA: Insufficient documentation

## 2020-10-23 DIAGNOSIS — D693 Immune thrombocytopenic purpura: Secondary | ICD-10-CM

## 2020-10-23 LAB — SAMPLE TO BLOOD BANK

## 2020-10-23 LAB — CBC WITH DIFFERENTIAL/PLATELET
Abs Immature Granulocytes: 0.42 10*3/uL — ABNORMAL HIGH (ref 0.00–0.07)
Basophils Absolute: 0.1 10*3/uL (ref 0.0–0.1)
Basophils Relative: 0 %
Eosinophils Absolute: 0 10*3/uL (ref 0.0–0.5)
Eosinophils Relative: 0 %
HCT: 34.8 % — ABNORMAL LOW (ref 36.0–46.0)
Hemoglobin: 11.3 g/dL — ABNORMAL LOW (ref 12.0–15.0)
Immature Granulocytes: 2 %
Lymphocytes Relative: 19 %
Lymphs Abs: 4.2 10*3/uL — ABNORMAL HIGH (ref 0.7–4.0)
MCH: 26.8 pg (ref 26.0–34.0)
MCHC: 32.5 g/dL (ref 30.0–36.0)
MCV: 82.7 fL (ref 80.0–100.0)
Monocytes Absolute: 0.6 10*3/uL (ref 0.1–1.0)
Monocytes Relative: 3 %
Neutro Abs: 17.2 10*3/uL — ABNORMAL HIGH (ref 1.7–7.7)
Neutrophils Relative %: 76 %
Platelets: 757 10*3/uL — ABNORMAL HIGH (ref 150–400)
RBC: 4.21 MIL/uL (ref 3.87–5.11)
RDW: 15.3 % (ref 11.5–15.5)
WBC: 22.5 10*3/uL — ABNORMAL HIGH (ref 4.0–10.5)
nRBC: 0 % (ref 0.0–0.2)

## 2020-10-23 LAB — TSH: TSH: 0.233 u[IU]/mL — ABNORMAL LOW (ref 0.308–3.960)

## 2020-10-23 LAB — PLATELET BY CITRATE

## 2020-10-23 LAB — IRON AND TIBC
Iron: 84 ug/dL (ref 41–142)
Saturation Ratios: 21 % (ref 21–57)
TIBC: 405 ug/dL (ref 236–444)
UIBC: 321 ug/dL (ref 120–384)

## 2020-10-23 LAB — FERRITIN: Ferritin: 42 ng/mL (ref 11–307)

## 2020-10-23 LAB — IMMATURE PLATELET FRACTION: Immature Platelet Fraction: 1.4 % (ref 1.2–8.6)

## 2020-10-23 LAB — T4, FREE: Free T4: 1.11 ng/dL (ref 0.61–1.12)

## 2020-10-23 LAB — VITAMIN B12: Vitamin B-12: 646 pg/mL (ref 180–914)

## 2020-10-23 LAB — HCG, SERUM, QUALITATIVE: Preg, Serum: NEGATIVE

## 2020-10-23 NOTE — Patient Instructions (Signed)
Thank you for choosing [facility] to provide your oncology and hematology care.   Should you have questions after your visit to the [facility] ([facility]), please contact this office at [phone] between 8:30 AM and 4:30 PM.  Voice mails left after 4:00 PM may not be returned until the following business day.  Calls received after 4:30 PM will be answered by an off-site Nurse Triage Line.    Prescription Refills:  Please have your pharmacy contact us directly for most prescription requests.  Contact the office directly for refills of narcotics (pain medications). Allow 48-72 hours for refills.  Appointments: Please contact the [facility] scheduling department [phone] for questions regarding [facility] appointment scheduling.  Contact the schedulers with any scheduling changes so that your appointment can be rescheduled in a timely manner.   Central Scheduling for [place] [phone] - Call to schedule procedures such as PET scans, CT scans, MRI, Ultrasound, etc.  To afford each patient quality time with our providers, please arrive 30 minutes before your scheduled appointment time.  If you arrive late for your appointment, you may be asked to reschedule.  We strive to give you quality time with our providers, and arriving late affects you and other patients whose appointments are after yours. If you are a no show for multiple scheduled visits, you may be dismissed from the clinic at the providers discretion.     Resources: [facility] Social Workers [phone] for additional information on assistance programs or assistance connecting with community support programs   [place] DSS  [phone]: Information regarding food stamps, Medicaid, and utility assistance [place] Access [place] [phone]   [place] [employer]'s shared-ride transportation service for eligible riders who have a disability that prevents them from riding the fixed route bus.   Medicare  Rights Center 800-333-4114 Helps people with Medicare understand their rights and benefits, navigate the Medicare system, and secure the quality healthcare they deserve [employer] 800-227-2345 Assists patients locate various types of support and financial assistance Cancer Care: 1-800-813-HOPE (4673) Provides financial assistance, online support groups, medication/co-pay assistance.   Transportation Assistance for appointments at [facility]: Transportation Coordinator [phone]  Again, thank you for choosing [facility] for your care.       

## 2020-10-23 NOTE — Telephone Encounter (Signed)
Added lab appointment for today per 2/21 los. Informed patient we would call regarding future appointments.

## 2020-10-30 ENCOUNTER — Inpatient Hospital Stay: Payer: Managed Care, Other (non HMO)

## 2020-10-30 ENCOUNTER — Other Ambulatory Visit: Payer: Self-pay

## 2020-10-30 DIAGNOSIS — D693 Immune thrombocytopenic purpura: Secondary | ICD-10-CM

## 2020-10-30 LAB — CBC WITH DIFFERENTIAL/PLATELET
Abs Immature Granulocytes: 0.13 10*3/uL — ABNORMAL HIGH (ref 0.00–0.07)
Basophils Absolute: 0.1 10*3/uL (ref 0.0–0.1)
Basophils Relative: 0 %
Eosinophils Absolute: 0.9 10*3/uL — ABNORMAL HIGH (ref 0.0–0.5)
Eosinophils Relative: 5 %
HCT: 39.2 % (ref 36.0–46.0)
Hemoglobin: 12.2 g/dL (ref 12.0–15.0)
Immature Granulocytes: 1 %
Lymphocytes Relative: 37 %
Lymphs Abs: 6.1 10*3/uL — ABNORMAL HIGH (ref 0.7–4.0)
MCH: 26.6 pg (ref 26.0–34.0)
MCHC: 31.1 g/dL (ref 30.0–36.0)
MCV: 85.4 fL (ref 80.0–100.0)
Monocytes Absolute: 1.4 10*3/uL — ABNORMAL HIGH (ref 0.1–1.0)
Monocytes Relative: 9 %
Neutro Abs: 7.9 10*3/uL — ABNORMAL HIGH (ref 1.7–7.7)
Neutrophils Relative %: 48 %
Platelets: 434 10*3/uL — ABNORMAL HIGH (ref 150–400)
RBC: 4.59 MIL/uL (ref 3.87–5.11)
RDW: 15.9 % — ABNORMAL HIGH (ref 11.5–15.5)
WBC: 16.4 10*3/uL — ABNORMAL HIGH (ref 4.0–10.5)
nRBC: 0 % (ref 0.0–0.2)

## 2020-11-06 ENCOUNTER — Inpatient Hospital Stay: Payer: Managed Care, Other (non HMO)

## 2020-11-06 ENCOUNTER — Inpatient Hospital Stay: Payer: Managed Care, Other (non HMO) | Attending: Hematology

## 2020-11-06 ENCOUNTER — Other Ambulatory Visit: Payer: Self-pay

## 2020-11-06 DIAGNOSIS — F17210 Nicotine dependence, cigarettes, uncomplicated: Secondary | ICD-10-CM | POA: Insufficient documentation

## 2020-11-06 DIAGNOSIS — D693 Immune thrombocytopenic purpura: Secondary | ICD-10-CM | POA: Insufficient documentation

## 2020-11-06 DIAGNOSIS — Z9081 Acquired absence of spleen: Secondary | ICD-10-CM | POA: Diagnosis not present

## 2020-11-06 DIAGNOSIS — E890 Postprocedural hypothyroidism: Secondary | ICD-10-CM | POA: Diagnosis not present

## 2020-11-06 LAB — CBC WITH DIFFERENTIAL/PLATELET
Abs Immature Granulocytes: 0.05 10*3/uL (ref 0.00–0.07)
Basophils Absolute: 0.1 10*3/uL (ref 0.0–0.1)
Basophils Relative: 1 %
Eosinophils Absolute: 0.4 10*3/uL (ref 0.0–0.5)
Eosinophils Relative: 3 %
HCT: 36.9 % (ref 36.0–46.0)
Hemoglobin: 11.8 g/dL — ABNORMAL LOW (ref 12.0–15.0)
Immature Granulocytes: 0 %
Lymphocytes Relative: 31 %
Lymphs Abs: 4.6 10*3/uL — ABNORMAL HIGH (ref 0.7–4.0)
MCH: 26.8 pg (ref 26.0–34.0)
MCHC: 32 g/dL (ref 30.0–36.0)
MCV: 83.7 fL (ref 80.0–100.0)
Monocytes Absolute: 0.7 10*3/uL (ref 0.1–1.0)
Monocytes Relative: 5 %
Neutro Abs: 9 10*3/uL — ABNORMAL HIGH (ref 1.7–7.7)
Neutrophils Relative %: 61 %
Platelets: 407 10*3/uL — ABNORMAL HIGH (ref 150–400)
RBC: 4.41 MIL/uL (ref 3.87–5.11)
RDW: 15.9 % — ABNORMAL HIGH (ref 11.5–15.5)
WBC: 14.9 10*3/uL — ABNORMAL HIGH (ref 4.0–10.5)
nRBC: 0 % (ref 0.0–0.2)

## 2020-11-06 NOTE — Progress Notes (Signed)
Plt >400 so pt did not nned Nplate per parameters. Provided copy of labs and dc'd home.

## 2020-11-12 NOTE — Progress Notes (Incomplete)
HEMATOLOGY/ONCOLOGY CONSULTATION NOTE  Date of Service: 11/12/2020  Patient Care Team: Patient, No Pcp Per as PCP - General (General Practice) Dr. Simone Curia - [facility]  CHIEF COMPLAINTS/PURPOSE OF CONSULTATION:  Chronic ITP  HISTORY OF PRESENTING ILLNESS:   Latoya Ayers is a wonderful 45 y.o. 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   Patient has a history of chronic ITP. She was diagnosed in 2008 and responded to steroids but relapsed with a taper. 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  Patient relapsed for the first time in late 2017 with platelets down to the 30k range and responded with improvement in platelet counts to more than 200k with dexamethasone. She has apparently had multiple treatments with dexamethasone in the last few years without durable response.  She underwent treatment with 4 doses of Rituxan from 3/18 through 12/07/2018 and was in remission for her chronic ITP till recently.  Patient presented to the emergency room at [facility] on 10/06/2020 as a transfer from [facility] with 2 days of heavy menstrual vaginal bleeding, headaches, diffuse body aches,  fevers, night sweats and petechiae in the skin and oropharynx.  She was diagnosed with Covid infection in an unvaccinated situation.  Her platelet counts were noted to be 0. She was treated with Lysteda and dexamethasone 40 mg p.o. daily for 4 days. Her platelet counts after steroid dose levels were noted to be more than 100k. She did receive 1 unit of platelets. She did not receive IVIG. Hemoglobin levels were stable at 12.5.  Her chronic ITP relapse was thought to be related to active Covid infection. She did not receive any specific Covid related therapeutics. She did have a CT of the head which showed no intracranial bleeding.  She has self-referred  to be seen by [place] for continued management of her chronic ITP. She notes she was very anxious since her platelets dropped to 0 and there was concern for significant bleeding.  She notes that in the last few days she has been taking 20 mg of dexamethasone daily because she was anxious her platelets might have dropped again.  On review of systems, pt reports no further fevers chills or night sweats. No shortness of breath. Resolution of all her Covid symptoms. No new bleeding or bruising. No current vaginal bleeding. No epistaxis hemoptysis hematemesis or GI bleeding. No current headaches.   INTERVAL HISTORY Latoya Ayers is a wonderful 45 y.o. 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 The patient's last visit with [place] was on 10/23/2020. The pt reports that she is doing well overall.  The pt reports ***  Lab results today 11/13/2020 of CBC w/diff and CMP is as follows: all values are WNL except for ***  On review of systems, pt reports *** and denies *** and any other symptoms.  MEDICAL HISTORY:  Past Medical History:  Diagnosis Date  . 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 Patient notes follicular adenoma removed in 2009. She has been on chronic levothyroxine replacement but her recent TSH was noted to be low at 0.068 and her levothyroxine dose was reduced to 150 mcg daily with a plan to follow-up with her primary care physician.  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 Notes that she  is up-to-date with her postsplenectomy vaccinations. Chronic smoker.   SURGICAL HISTORY:  Past Surgical History:  Procedure Laterality Date  . LEG SURGERY    Status post splenectomy- for chronic ITP Status post thyroidectomy for follicular adenoma.  SOCIAL HISTORY: Social History   Socioeconomic History  . Marital status: Married    Spouse name: Not on file  . Number of  children: Not on file  . Years of education: Not on file  . Highest education level: Not on file  Occupational History  . Not on file  Tobacco Use  . Smoking status: Current Every Day Smoker    Packs/day: 1.00  . Smokeless tobacco: Never Used  Substance and Sexual Activity  . Alcohol use: Yes    Comment: socially  . Drug use: No  . Sexual activity: Not on file  Other Topics Concern  . Not on file  Social History Narrative  . Not on file   Social Determinants of Health   Financial Resource Strain: Not on file  Food Insecurity: Not on file  Transportation Needs: Not on file  Physical Activity: Not on file  Stress: Not on file  Social Connections: Not on file  Intimate Partner Violence: Not on file  smoking  vaping 20 puffs a day suboxone  FAMILY HISTORY:  Breast cancer -- family hx  ALLERGIES:  is allergic to penicillins.    MEDICATIONS:  Current Outpatient Medications  Medication Sig Dispense Refill  . albuterol (VENTOLIN HFA) 108 (90 Base) MCG/ACT inhaler Inhale 2 puffs into the lungs every 4 (four) hours as needed.    . Buprenorphine HCl-Naloxone HCl 8-2 MG FILM Place under the tongue daily.    . cyanocobalamin 1000 MCG tablet Take by mouth.    . hydrochlorothiazide (MICROZIDE) 12.5 MG capsule Take by mouth.    . levothyroxine (SYNTHROID) 150 MCG tablet Take 150 mcg by mouth.    . Multiple Vitamin (MULTI-VITAMIN) tablet Take 1 tablet by mouth daily.    . nicotine (NICODERM CQ - DOSED IN MG/24 HOURS) 21 mg/24hr patch Place onto the skin. (Patient not taking: Reported on 10/23/2020)    . phentermine 37.5 MG capsule Take by mouth.    . sulfamethoxazole-trimethoprim (BACTRIM DS) 800-160 MG tablet Take 1 tablet by mouth daily. (Patient not taking: Reported on 10/23/2020)    . tranexamic acid (LYSTEDA) 650 MG TABS tablet      No current facility-administered medications for this visit.    REVIEW OF SYSTEMS:   10 Point review of Systems was done is negative except as  noted above.  PHYSICAL EXAMINATION: ECOG PERFORMANCE STATUS: 1 - Symptomatic but completely ambulatory  . There were no vitals filed for this visit. There were no vitals filed for this visit. .There is no height or weight on file to calculate BMI.  *** GENERAL:alert, in no acute distress and comfortable SKIN: no acute rashes, no significant lesions EYES: conjunctiva are pink and non-injected, sclera anicteric OROPHARYNX: MMM, no exudates, no oropharyngeal erythema or ulceration NECK: supple, no JVD LYMPH:  no palpable lymphadenopathy in the cervical, axillary or inguinal regions LUNGS: clear to auscultation b/l with normal respiratory effort HEART: regular rate & rhythm ABDOMEN:  normoactive bowel sounds , non tender, not distended. Extremity: no pedal edema PSYCH: alert & oriented x 3 with fluent speech NEURO: no focal motor/sensory deficits  LABORATORY DATA:  I have reviewed the data as listed  . CBC Latest Ref Rng & Units 11/06/2020 10/30/2020 10/23/2020  WBC 4.0 - 10.5 K/uL  14.9(H) 16.4(H) 22.5(H)  Hemoglobin 12.0 - 15.0 g/dL 11.8(L) 12.2 11.3(L)  Hematocrit 36.0 - 46.0 % 36.9 39.2 34.8(L)  Platelets 150 - 400 K/uL 407(H) 434(H) 757(H)    . CMP Latest Ref Rng & Units 06/07/2016  Glucose 65 - 99 mg/dL 098(J165(H)  BUN 6 - 20 mg/dL <1(B<5(L)  Creatinine 1.470.44 - 1.00 mg/dL 8.290.98  Sodium 562135 - 130145 mmol/L 136  Potassium 3.5 - 5.1 mmol/L 4.0  Chloride 101 - 111 mmol/L 102  CO2 22 - 32 mmol/L 23  Calcium 8.9 - 10.3 mg/dL 8.6(V8.7(L)  Total Protein 6.5 - 8.1 g/dL 6.9  Total Bilirubin 0.3 - 1.2 mg/dL 0.7  Alkaline Phos 38 - 126 U/L 81  AST 15 - 41 U/L 28  ALT 14 - 54 U/L 18   Component     Latest Ref Rng & Units 10/23/2020  Iron     41 - 142 ug/dL 84  TIBC     784236 - 696444 ug/dL 295405  Saturation Ratios     21 - 57 % 21  UIBC     120 - 384 ug/dL 284321  Preg, Serum     NEGATIVE NEGATIVE  Vitamin B12     180 - 914 pg/mL 646  T4,Free(Direct)     0.61 - 1.12 ng/dL 1.321.11  TSH     4.4010.308 -  3.960 uIU/mL 0.233 (L)  Ferritin     11 - 307 ng/mL 42  Immature Platelet Fraction     1.2 - 8.6 % 1.4    RADIOGRAPHIC STUDIES: I have personally reviewed the radiological images as listed and agreed with the findings in the report. No results found.  ASSESSMENT & PLAN:   45 year old female with history of chronic ITP with details as noted above with  1. Relapse of chronic ITP due to recent COVID-19 infection on 10/06/2020 with platelet counts of 0. She has had recent induction dose of dexamethasone 40 mg p.o. daily for 4 days. And herself decided to repeat dexamethasone 20 mg daily for additional 3 days until about 1 or 2 days ago.  Platelets today have bounced back to 757k. She has all been able to maintain durable response to steroids in the recent few years.   She was diagnosed in 2008 and responded to steroids but relapsed with a taper. 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  Patient relapsed for the first time in late 2017 with platelets down to the 30k range and responded with improvement in platelet counts to more than 200k with dexamethasone. She has apparently had multiple treatments with dexamethasone in the last few years without durable response.  She underwent treatment with 4 doses of Rituxan from 3/18 through 12/07/2018 and was in remission for her chronic ITP till recently.  PLAN -Discussed pt labwork today, 11/13/2020; ***   -We will plan to set her up for weekly labs x4 to monitor her platelets and start Nplate if her platelets drop again and she does not show signs of sustained remission. -Will see back ***  2. Iron deficiency anemia . Lab Results  Component Value Date   IRON 84 10/23/2020   TIBC 405 10/23/2020   IRONPCTSAT 21 10/23/2020   (Iron and TIBC)  Lab Results  Component Value Date   FERRITIN 42 10/23/2020   Plan -Continue over-the-counter ferrous sulfate 325 mg p.o. daily  3. History of  B12 deficiency B12 level today is at 646 PLan -Continue B12  1000 mcg p.o. daily  4. Hypothyroidism-status post thyroidectomy in 2009 for follicular adenoma. Plan -On chronic levothyroxine. Dose recently reduced to 150 mcg at [facility] due to elevated TSH levels. Her TSH levels are moving towards normal. -She will continue to follow-up with her primary care physician to continue chronic thyroid replacement.  5. FHX of breast cancer in multiple relatives -We will refer to genetic counseling once her acute ITP relapse has stabilized.  6. History of opiate abuse currently on chronic opiate agonist therapy with Suboxone being managed by her primary care physician.  7. H/o Hidradenitis suppurative  FOLLOW UP: ***  All of the patients questions were answered with apparent satisfaction. The patient knows to call the clinic with any problems, questions or concerns.   The total time spent in the appointment was *** minutes and more than 50% was on counseling and direct patient cares.     Wyvonnia Lora MD MS AAHIVMS [facility] Hematology/Oncology Physician [facility]  (Office):       [phone] (Work cell):  [phone] (Fax):           [phone]  11/12/2020 10:32 AM  I, Minda Meo, am acting as scribe for Dr. Wyvonnia Lora, MD.

## 2020-11-13 ENCOUNTER — Inpatient Hospital Stay: Payer: Managed Care, Other (non HMO)

## 2020-11-13 ENCOUNTER — Telehealth: Payer: Self-pay | Admitting: *Deleted

## 2020-11-13 ENCOUNTER — Inpatient Hospital Stay: Payer: Managed Care, Other (non HMO) | Admitting: Hematology

## 2020-11-13 NOTE — Telephone Encounter (Signed)
Patient missed appts this morning for labs/see Dr. Wynn Maudlin injection (if needed). Called patient and left general VM on un-named VM to call office to reschedule.

## 2020-11-17 ENCOUNTER — Telehealth: Payer: Self-pay | Admitting: Hematology

## 2020-11-17 NOTE — Telephone Encounter (Signed)
Rescheduled 03/14 appointments to 03/21 per 03/17 scheduled message. Called and spoke with patient's husband, patient should be notified of upcoming appointments.

## 2020-11-19 NOTE — Progress Notes (Signed)
HEMATOLOGY/ONCOLOGY CONSULTATION NOTE  Date of Service: 11/20/2020  Patient Care Team: Patient, No Pcp Per as PCP - General (General Practice) Dr. Simone Curia - [facility]  CHIEF COMPLAINTS/PURPOSE OF CONSULTATION:  Chronic ITP  HISTORY OF PRESENTING ILLNESS:   Latoya Ayers is a wonderful 45 y.o. 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   Patient has a history of chronic ITP. She was diagnosed in 2008 and responded to steroids but relapsed with a taper. 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  Patient relapsed for the first time in late 2017 with platelets down to the 30k range and responded with improvement in platelet counts to more than 200k with dexamethasone. She has apparently had multiple treatments with dexamethasone in the last few years without durable response.  She underwent treatment with 4 doses of Rituxan from 3/18 through 12/07/2018 and was in remission for her chronic ITP till recently.  Patient presented to the emergency room at [facility] on 10/06/2020 as a transfer from [place] with 2 days of heavy menstrual vaginal bleeding, headaches, diffuse body aches,  fevers, night sweats and petechiae in the skin and oropharynx.  She was diagnosed with Covid infection in an unvaccinated situation.  Her platelet counts were noted to be 0. She was treated with Lysteda and dexamethasone 40 mg p.o. daily for 4 days. Her platelet counts after steroid dose levels were noted to be more than 100k. She did receive 1 unit of platelets. She did not receive IVIG. Hemoglobin levels were stable at 12.5.  Her chronic ITP relapse was thought to be related to active Covid infection. She did not receive any specific Covid related therapeutics. She did have a CT of the head which showed no intracranial bleeding.  She has self-referred  to be seen by [place] for continued management of her chronic ITP. She notes she was very anxious since her platelets dropped to 0 and there was concern for significant bleeding.  She notes that in the last few days she has been taking 20 mg of dexamethasone daily because she was anxious her platelets might have dropped again.  On review of systems, pt reports no further fevers chills or night sweats. No shortness of breath. Resolution of all her Covid symptoms. No new bleeding or bruising. No current vaginal bleeding. No epistaxis hemoptysis hematemesis or GI bleeding. No current headaches.  INTERVAL HISTORY  Latoya Ayers is a wonderful 45 y.o. 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 The pt reports that she is doing well overall.  The pt reports no new symptoms or concerns. The pt notes that she has not had a period since she was hospitalized, but should not be able to have one due to a procedure to tie together the fallopian tubes.   Lab results today 11/20/2020 of CBC w/diff is as follows: all values are WNL except for WBC of 13.7K, RDW of 15.7, Plt of 534K.  On review of systems, pt reports chronic swollen ankle and denies bleeding issues, gum bleeds, bruising, nose bleeds, fevers, chills, night sweats,  and any other symptoms.   MEDICAL HISTORY:  Past Medical History:  Diagnosis Date  . 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 Patient notes follicular adenoma removed  in 2009. She has been on chronic levothyroxine replacement but her recent TSH was noted to be low at 0.068 and her levothyroxine dose was reduced to 150 mcg daily with a plan to follow-up with her primary care physician.  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 Notes that she is up-to-date with her postsplenectomy vaccinations. Chronic smoker.   SURGICAL  HISTORY:  Past Surgical History:  Procedure Laterality Date  . LEG SURGERY    Status post splenectomy- for chronic ITP Status post thyroidectomy for follicular adenoma.  SOCIAL HISTORY: Social History   Socioeconomic History  . Marital status: Married    Spouse name: Not on file  . Number of children: Not on file  . Years of education: Not on file  . Highest education level: Not on file  Occupational History  . Not on file  Tobacco Use  . Smoking status: Current Every Day Smoker    Packs/day: 1.00  . Smokeless tobacco: Never Used  Substance and Sexual Activity  . Alcohol use: Yes    Comment: socially  . Drug use: No  . Sexual activity: Not on file  Other Topics Concern  . Not on file  Social History Narrative  . Not on file   Social Determinants of Health   Financial Resource Strain: Not on file  Food Insecurity: Not on file  Transportation Needs: Not on file  Physical Activity: Not on file  Stress: Not on file  Social Connections: Not on file  Intimate Partner Violence: Not on file  smoking  vaping 20 puffs a day suboxone  FAMILY HISTORY:  Breast cancer -- family hx  ALLERGIES:  is allergic to penicillins.    MEDICATIONS:  Current Outpatient Medications  Medication Sig Dispense Refill  . albuterol (VENTOLIN HFA) 108 (90 Base) MCG/ACT inhaler Inhale 2 puffs into the lungs every 4 (four) hours as needed.    . Buprenorphine HCl-Naloxone HCl 8-2 MG FILM Place under the tongue daily.    . cyanocobalamin 1000 MCG tablet Take by mouth.    . hydrochlorothiazide (MICROZIDE) 12.5 MG capsule Take by mouth.    . levothyroxine (SYNTHROID) 150 MCG tablet Take 150 mcg by mouth.    . Multiple Vitamin (MULTI-VITAMIN) tablet Take 1 tablet by mouth daily.    . nicotine (NICODERM CQ - DOSED IN MG/24 HOURS) 21 mg/24hr patch Place onto the skin. (Patient not taking: Reported on 10/23/2020)    . phentermine 37.5 MG capsule Take by mouth.    . sulfamethoxazole-trimethoprim  (BACTRIM DS) 800-160 MG tablet Take 1 tablet by mouth daily. (Patient not taking: Reported on 10/23/2020)    . tranexamic acid (LYSTEDA) 650 MG TABS tablet      No current facility-administered medications for this visit.    REVIEW OF SYSTEMS:   10 Point review of Systems was done is negative except as noted above.  PHYSICAL EXAMINATION: ECOG PERFORMANCE STATUS: 1 - Symptomatic but completely ambulatory  . Vitals:   11/20/20 0910  BP: 131/67  Pulse: 87  Resp: 16  Temp: 97.8 F (36.6 C)  SpO2: 100%   Filed Weights   11/20/20 0910  Weight: 297 lb 3.2 oz (134.8 kg)   .Body mass index is 51.01 kg/m.  GENERAL:alert, in no acute distress and comfortable SKIN: no acute rashes, no significant lesions EYES: conjunctiva are pink and non-injected, sclera anicteric OROPHARYNX: MMM, no exudates, no oropharyngeal erythema or ulceration NECK: supple, no JVD LYMPH:  no palpable lymphadenopathy in the  cervical, axillary or inguinal regions LUNGS: clear to auscultation b/l with normal respiratory effort HEART: regular rate & rhythm ABDOMEN:  normoactive bowel sounds , non tender, not distended. Extremity: no pedal edema PSYCH: alert & oriented x 3 with fluent speech NEURO: no focal motor/sensory deficits   LABORATORY DATA:  I have reviewed the data as listed  . CBC Latest Ref Rng & Units 11/20/2020 11/06/2020 10/30/2020  WBC 4.0 - 10.5 K/uL 13.7(H) 14.9(H) 16.4(H)  Hemoglobin 12.0 - 15.0 g/dL 48.5 11.8(L) 12.2  Hematocrit 36.0 - 46.0 % 39.3 36.9 39.2  Platelets 150 - 400 K/uL 534(H) 407(H) 434(H)    . CMP Latest Ref Rng & Units 06/07/2016  Glucose 65 - 99 mg/dL 462(V)  BUN 6 - 20 mg/dL <0(J)  Creatinine 5.00 - 1.00 mg/dL 9.38  Sodium 182 - 993 mmol/L 136  Potassium 3.5 - 5.1 mmol/L 4.0  Chloride 101 - 111 mmol/L 102  CO2 22 - 32 mmol/L 23  Calcium 8.9 - 10.3 mg/dL 7.1(I)  Total Protein 6.5 - 8.1 g/dL 6.9  Total Bilirubin 0.3 - 1.2 mg/dL 0.7  Alkaline Phos 38 - 126 U/L 81   AST 15 - 41 U/L 28  ALT 14 - 54 U/L 18   Component     Latest Ref Rng & Units 10/23/2020  Iron     41 - 142 ug/dL 84  TIBC     967 - 893 ug/dL 810  Saturation Ratios     21 - 57 % 21  UIBC     120 - 384 ug/dL 175  Preg, Serum     NEGATIVE NEGATIVE  Vitamin B12     180 - 914 pg/mL 646  T4,Free(Direct)     0.61 - 1.12 ng/dL 1.02  TSH     5.852 - 7.782 uIU/mL 0.233 (L)  Ferritin     11 - 307 ng/mL 42  Immature Platelet Fraction     1.2 - 8.6 % 1.4    RADIOGRAPHIC STUDIES: I have personally reviewed the radiological images as listed and agreed with the findings in the report. No results found.  ASSESSMENT & PLAN:   45 year old female with history of chronic ITP with details as noted above with  1. Relapse of chronic ITP due to recent COVID-19 infection on 10/06/2020 with platelet counts of 0. She has had recent induction dose of dexamethasone 40 mg p.o. daily for 4 days. And herself decided to repeat dexamethasone 20 mg daily for additional 3 days until about 1 or 2 days ago.  Platelets today have bounced back to 757k. She has all been able to maintain durable response to steroids in the recent few years.   She was diagnosed in 2008 and responded to steroids but relapsed with a taper. 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  Patient relapsed for the first time in late 2017 with platelets down to the 30k range and responded with improvement in platelet counts to more than 200k with dexamethasone. She has apparently had multiple treatments with dexamethasone in the last few years without durable response.  She underwent treatment with 4 doses of Rituxan from 3/18 through 12/07/2018 and was in remission for her chronic ITP till recently.  PLAN: -Discussed pt labwork today, 11/20/2020; Plt still normal.  -Advised pt that the sudden drop in Plt was most likely due to COVID infection and the steroids help to bring her back into  a sustained remission at this  time.  -Advised pt that the spleen is what pulls out the Plt coated by the antibodies. 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 -Will not need Nplate today with normal Plt count. -Advised pt to avoid any medications that can affect the Plt counts. -Recommended pt f/u w PCP regarding missed period and pregnancy testing.  -Will get labs in 1 month. -Will see back in 2 months with labs. Pt is aware to contact if any bleeding issues occur.  2. Iron deficiency anemia . Lab Results  Component Value Date   IRON 84 10/23/2020   TIBC 405 10/23/2020   IRONPCTSAT 21 10/23/2020   (Iron and TIBC)  Lab Results  Component Value Date   FERRITIN 42 10/23/2020   Plan: -Continue over-the-counter ferrous sulfate 325 mg p.o. daily  3. History of B12 deficiency B12 level today is at 646 PLan -Continue B12 1000 mcg p.o. daily  4. Hypothyroidism-status post thyroidectomy in 2009 for follicular adenoma. Plan -On chronic levothyroxine. Dose [facility] 150 [facility] due to elevated TSH levels. Her TSH levels are moving towards normal. -She will continue to follow-up with her primary care physician to continue chronic thyroid replacement.  5. FHX of breast cancer in multiple relatives -We will refer to genetic counseling once her acute ITP relapse has stabilized.  6. History of opiate abuse currently on chronic opiate agonist therapy with Suboxone being managed by her primary care physician.  7. H/o Hidradenitis suppurative  FOLLOW UP: Labs in 1 month Return to clinic with Dr. Candise CheKale with labs in 2 months  All of the patients questions were answered with apparent satisfaction. The patient knows to call the clinic with any problems, questions or concerns.  The total time spent in the appointment was 20 minutes and more than 50% was on counseling and direct patient cares.    Wyvonnia LoraGautam Lashonta Pilling MD MS AAHIVMS [facility]  [facility] Hematology/Oncology Physician [facility]  (Office):       [phone] (Work cell):  [phone] (Fax):           [phone]  11/20/2020 9:40 AM  I, Minda Meooss Judd, am acting as scribe for Dr. Wyvonnia LoraGautam Talena Neira, MD.   .I have reviewed the above documentation for accuracy and completeness, and I agree with the above. Johney MaineGautam Kishore Sreenidhi Ganson MD

## 2020-11-20 ENCOUNTER — Inpatient Hospital Stay (HOSPITAL_BASED_OUTPATIENT_CLINIC_OR_DEPARTMENT_OTHER): Payer: Managed Care, Other (non HMO) | Admitting: Hematology

## 2020-11-20 ENCOUNTER — Inpatient Hospital Stay: Payer: Managed Care, Other (non HMO)

## 2020-11-20 ENCOUNTER — Other Ambulatory Visit: Payer: Self-pay

## 2020-11-20 VITALS — BP 131/67 | HR 87 | Temp 97.8°F | Resp 16 | Ht 64.0 in | Wt 297.2 lb

## 2020-11-20 DIAGNOSIS — D693 Immune thrombocytopenic purpura: Secondary | ICD-10-CM

## 2020-11-20 LAB — CBC WITH DIFFERENTIAL/PLATELET
Abs Immature Granulocytes: 0.05 10*3/uL (ref 0.00–0.07)
Basophils Absolute: 0.1 10*3/uL (ref 0.0–0.1)
Basophils Relative: 1 %
Eosinophils Absolute: 0.4 10*3/uL (ref 0.0–0.5)
Eosinophils Relative: 3 %
HCT: 39.3 % (ref 36.0–46.0)
Hemoglobin: 12.5 g/dL (ref 12.0–15.0)
Immature Granulocytes: 0 %
Lymphocytes Relative: 35 %
Lymphs Abs: 4.8 10*3/uL — ABNORMAL HIGH (ref 0.7–4.0)
MCH: 26.7 pg (ref 26.0–34.0)
MCHC: 31.8 g/dL (ref 30.0–36.0)
MCV: 83.8 fL (ref 80.0–100.0)
Monocytes Absolute: 1 10*3/uL (ref 0.1–1.0)
Monocytes Relative: 7 %
Neutro Abs: 7.4 10*3/uL (ref 1.7–7.7)
Neutrophils Relative %: 54 %
Platelets: 534 10*3/uL — ABNORMAL HIGH (ref 150–400)
RBC: 4.69 MIL/uL (ref 3.87–5.11)
RDW: 15.7 % — ABNORMAL HIGH (ref 11.5–15.5)
WBC: 13.7 10*3/uL — ABNORMAL HIGH (ref 4.0–10.5)
nRBC: 0 % (ref 0.0–0.2)

## 2020-11-22 ENCOUNTER — Telehealth: Payer: Self-pay | Admitting: Hematology

## 2020-11-22 NOTE — Telephone Encounter (Signed)
Left message with follow-up appointments per 3/21 los. Gave option to call back to reschedule if needed. 

## 2020-12-21 ENCOUNTER — Inpatient Hospital Stay: Payer: Managed Care, Other (non HMO)

## 2020-12-21 ENCOUNTER — Telehealth: Payer: Self-pay | Admitting: Hematology

## 2020-12-21 NOTE — Telephone Encounter (Signed)
R/s appt per 4/21 sch msg. Pt aware.  

## 2020-12-22 ENCOUNTER — Inpatient Hospital Stay: Payer: Managed Care, Other (non HMO) | Attending: Hematology

## 2021-01-18 NOTE — Progress Notes (Signed)
HEMATOLOGY/ONCOLOGY CONSULTATION NOTE  Date of Service: 01/19/2021  Patient Care Team: Patient, No Pcp Per (Inactive) as PCP - General (General Practice) Dr. Simone Curia - [facility]  CHIEF COMPLAINTS/PURPOSE OF CONSULTATION:  Chronic ITP  HISTORY OF PRESENTING ILLNESS:   Latoya Ayers is a wonderful 45 y.o. 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   Patient has a history of chronic ITP. She was diagnosed in 2008 and responded to steroids but relapsed with a taper. 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  Patient relapsed for the first time in late 2017 with platelets down to the 30k range and responded with improvement in platelet counts to more than 200k with dexamethasone. She has apparently had multiple treatments with dexamethasone in the last few years without durable response.  She underwent treatment with 4 doses of Rituxan from 3/18 through 12/07/2018 and was in remission for her chronic ITP till recently.  Patient presented to the emergency room at [facility] on 10/06/2020 as a transfer from [place] with 2 days of heavy menstrual vaginal bleeding, headaches, diffuse body aches,  fevers, night sweats and petechiae in the skin and oropharynx.  She was diagnosed with Covid infection in an unvaccinated situation.  Her platelet counts were noted to be 0. She was treated with Lysteda and dexamethasone 40 mg p.o. daily for 4 days. Her platelet counts after steroid dose levels were noted to be more than 100k. She did receive 1 unit of platelets. She did not receive IVIG. Hemoglobin levels were stable at 12.5.  Her chronic ITP relapse was thought to be related to active Covid infection. She did not receive any specific Covid related therapeutics. She did have a CT of the head which showed no intracranial bleeding.  She has  self-referred to be seen by [place] for continued management of her chronic ITP. She notes she was very anxious since her platelets dropped to 0 and there was concern for significant bleeding.  She notes that in the last few days she has been taking 20 mg of dexamethasone daily because she was anxious her platelets might have dropped again.  On review of systems, pt reports no further fevers chills or night sweats. No shortness of breath. Resolution of all her Covid symptoms. No new bleeding or bruising. No current vaginal bleeding. No epistaxis hemoptysis hematemesis or GI bleeding. No current headaches.  INTERVAL HISTORY  Latoya Ayers is a wonderful 45 y.o. 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 The pt reports that she is doing well overall.  The pt reports that she has been very anxious about her plt numbers. She has been starting to bruise again lately, but denies any trauma. She notes this could be due to trauma and she did not pay attention. She denies any new or changed medications. She recently finished a course of amoxicillin one week ago for a UTI. She was started on a muscle relaxer for her pulled muscle recently. She has taken some Motrin for controlling the pain. She notes she took 4 motrin and 2 tylenol. She has also been taking chlorophyll in her water. The pt denies any smoking recently. She notes her HS is starting to come back as well. The pt notes her gums were bleeding randomly a  few weeks ago.   Lab results today 01/19/2021 of CBC w/diff is as follows: all values are WNL except for WBC of 13.4K, RDW of 16.1, Plt of 124K.   On review of systems, pt reports bruising, recent gum bleeds, recent infection issue and denies new lumps/bumps, back pain, abdominal pain, leg swelling, and any other symptoms.  MEDICAL HISTORY:  Past Medical History:  Diagnosis Date  . 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 Patient notes follicular adenoma removed in 2009. She has been on chronic levothyroxine replacement but her recent TSH was noted to be low at 0.068 and her levothyroxine dose was reduced to 150 mcg daily with a plan to follow-up with her primary care physician.  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 Notes that she is up-to-date with her postsplenectomy vaccinations. Chronic smoker.   SURGICAL HISTORY:  Past Surgical History:  Procedure Laterality Date  . LEG SURGERY    Status post splenectomy- for chronic ITP Status post thyroidectomy for follicular adenoma.  SOCIAL HISTORY: Social History   Socioeconomic History  . Marital status: Married    Spouse name: Not on file  . Number of children: Not on file  . Years of education: Not on file  . Highest education level: Not on file  Occupational History  . Not on file  Tobacco Use  . Smoking status: Current Every Day Smoker    Packs/day: 1.00  . Smokeless tobacco: Never Used  Substance and Sexual Activity  . Alcohol use: Yes    Comment: socially  . Drug use: No  . Sexual activity: Not on file  Other Topics Concern  . Not on file  Social History Narrative  . Not on file   Social Determinants of Health   Financial Resource Strain: Not on file  Food Insecurity: Not on file  Transportation Needs: Not on file  Physical Activity: Not on file  Stress: Not on file  Social Connections: Not on file  Intimate Partner Violence: Not on file  smoking  vaping 20 puffs a day suboxone  FAMILY HISTORY:  Breast cancer -- family hx  ALLERGIES:  is allergic to penicillins.    MEDICATIONS:  Current Outpatient Medications  Medication Sig Dispense Refill  . albuterol (VENTOLIN HFA) 108 (90 Base) MCG/ACT inhaler Inhale 2 puffs into the lungs every 4 (four) hours as needed.    . Buprenorphine HCl-Naloxone HCl 8-2 MG FILM Place under the tongue  daily.    . cyanocobalamin 1000 MCG tablet Take by mouth.    . hydrochlorothiazide (MICROZIDE) 12.5 MG capsule Take by mouth.    . levothyroxine (SYNTHROID) 150 MCG tablet Take 150 mcg by mouth.    . Multiple Vitamin (MULTI-VITAMIN) tablet Take 1 tablet by mouth daily.    . nicotine (NICODERM CQ - DOSED IN MG/24 HOURS) 21 mg/24hr patch Place onto the skin. (Patient not taking: Reported on 10/23/2020)    . phentermine 37.5 MG capsule Take by mouth.    . sulfamethoxazole-trimethoprim (BACTRIM DS) 800-160 MG tablet Take 1 tablet by mouth daily. (Patient not taking: Reported on 10/23/2020)    . tranexamic acid (LYSTEDA) 650 MG TABS tablet      No current facility-administered medications for this visit.    REVIEW OF SYSTEMS:   10 Point review of Systems was done is negative except as noted above.  PHYSICAL EXAMINATION: ECOG PERFORMANCE STATUS: 1 - Symptomatic but completely ambulatory  . Vitals:  01/19/21 1307  BP: 132/80  Pulse: 71  Resp: 18  Temp: (!) 97.3 F (36.3 C)  SpO2: 99%   Filed Weights   01/19/21 1307  Weight: 299 lb 4.8 oz (135.8 kg)   .Body mass index is 51.37 kg/m.   GENERAL:alert, in no acute distress and comfortable SKIN: no acute rashes, no significant lesions EYES: conjunctiva are pink and non-injected, sclera anicteric OROPHARYNX: MMM, no exudates, no oropharyngeal erythema or ulceration NECK: supple, no JVD LYMPH:  no palpable lymphadenopathy in the cervical, axillary or inguinal regions LUNGS: clear to auscultation b/l with normal respiratory effort HEART: regular rate & rhythm ABDOMEN:  normoactive bowel sounds , non tender, not distended. Extremity: no pedal edema PSYCH: alert & oriented x 3 with fluent speech NEURO: no focal motor/sensory deficits   LABORATORY DATA:  I have reviewed the data as listed  . CBC Latest Ref Rng & Units 01/19/2021 11/20/2020 11/06/2020  WBC 4.0 - 10.5 K/uL 13.4(H) 13.7(H) 14.9(H)  Hemoglobin 12.0 - 15.0 g/dL 16.112.2 09.612.5  11.8(L)  Hematocrit 36.0 - 46.0 % 38.9 39.3 36.9  Platelets 150 - 400 K/uL 124(L) 534(H) 407(H)    . CMP Latest Ref Rng & Units 06/07/2016  Glucose 65 - 99 mg/dL 045(W165(H)  BUN 6 - 20 mg/dL <0(J<5(L)  Creatinine 8.110.44 - 1.00 mg/dL 9.140.98  Sodium 782135 - 956145 mmol/L 136  Potassium 3.5 - 5.1 mmol/L 4.0  Chloride 101 - 111 mmol/L 102  CO2 22 - 32 mmol/L 23  Calcium 8.9 - 10.3 mg/dL 2.1(H8.7(L)  Total Protein 6.5 - 8.1 g/dL 6.9  Total Bilirubin 0.3 - 1.2 mg/dL 0.7  Alkaline Phos 38 - 126 U/L 81  AST 15 - 41 U/L 28  ALT 14 - 54 U/L 18   Component     Latest Ref Rng & Units 10/23/2020  Iron     41 - 142 ug/dL 84  TIBC     086236 - 578444 ug/dL 469405  Saturation Ratios     21 - 57 % 21  UIBC     120 - 384 ug/dL 629321  Preg, Serum     NEGATIVE NEGATIVE  Vitamin B12     180 - 914 pg/mL 646  T4,Free(Direct)     0.61 - 1.12 ng/dL 5.281.11  TSH     4.1320.308 - 4.4013.960 uIU/mL 0.233 (L)  Ferritin     11 - 307 ng/mL 42  Immature Platelet Fraction     1.2 - 8.6 % 1.4    RADIOGRAPHIC STUDIES: I have personally reviewed the radiological images as listed and agreed with the findings in the report. No results found.  ASSESSMENT & PLAN:   45 year old female with history of chronic ITP with details as noted above with  1. Relapse of chronic ITP due to recent COVID-19 infection on 10/06/2020 with platelet counts of 0. She has had recent induction dose of dexamethasone 40 mg p.o. daily for 4 days. And herself decided to repeat dexamethasone 20 mg daily for additional 3 days until about 1 or 2 days ago.  Platelets today have bounced back to 757k. She has all been able to maintain durable response to steroids in the recent few years.   She was diagnosed in 2008 and responded to steroids but relapsed with a taper. 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  Patient relapsed for the first time in late 2017 with platelets down to the 30k range and responded with improvement  in  platelet counts to more than 200k with dexamethasone. She has apparently had multiple treatments with dexamethasone in the last few years without durable response.  She underwent treatment with 4 doses of Rituxan from 3/18 through 12/07/2018 and was in remission for her chronic ITP till recently.  PLAN: -Discussed pt labwork today, 01/19/2021; Plt starting to decrease gradually. WBC slightly higher. -Recommended pt avoid NSAIDs except Tylenol. -Advised pt to be careful with taking supplements. -Recommended pt continue to work on living an active lifestyle and eating healthier to work towards a more positive weight. -Advised pt to avoid any medications that can affect the Plt counts. -Will see back in 6 weeks via phone with labs 1 day prior.  2. Iron deficiency anemia . Lab Results  Component Value Date   IRON 84 10/23/2020   TIBC 405 10/23/2020   IRONPCTSAT 21 10/23/2020   (Iron and TIBC)  Lab Results  Component Value Date   FERRITIN 42 10/23/2020   Plan: -Continue over-the-counter ferrous sulfate 325 mg p.o. daily  3. History of B12 deficiency B12 level today is at 646 PLan -Continue B12 1000 mcg p.o. daily  4. Hypothyroidism-status post thyroidectomy in 2009 for follicular adenoma. Plan -On chronic levothyroxine. Dose recently reduced to 150 mcg at [facility] due to elevated TSH levels. Her TSH levels are moving towards normal. -She will continue to follow-up with her primary care physician to continue chronic thyroid replacement.  5. FHX of breast cancer in multiple relatives -We will refer to genetic counseling once her acute ITP relapse has stabilized.  6. History of opiate abuse currently on chronic opiate agonist therapy with Suboxone being managed by her primary care physician.  7. H/o Hidradenitis suppurative  FOLLOW UP: Phone visit with Dr Candise Che in 6 week. Labs 1 day prior to phone visit   All of the patients questions were answered with apparent  satisfaction. The patient knows to call the clinic with any problems, questions or concerns.  The total time spent in the appointment was 20 minutes and more than 50% was on counseling and direct patient cares.    Wyvonnia Lora MD MS AAHIVMS [facility] Hematology/Oncology Physician [facility]  (Office):       [phone] (Work cell):  [phone] (Fax):           [phone]  01/19/2021 1:22 PM  I, Minda Meo, am acting as scribe for Dr. Wyvonnia Lora, MD.   .I have reviewed the above documentation for accuracy and completeness, and I agree with the above. Johney Maine MD

## 2021-01-19 ENCOUNTER — Other Ambulatory Visit: Payer: Self-pay

## 2021-01-19 ENCOUNTER — Inpatient Hospital Stay: Payer: Managed Care, Other (non HMO) | Attending: Hematology | Admitting: Hematology

## 2021-01-19 ENCOUNTER — Inpatient Hospital Stay: Payer: Managed Care, Other (non HMO)

## 2021-01-19 VITALS — BP 132/80 | HR 71 | Temp 97.3°F | Resp 18 | Ht 64.0 in | Wt 299.3 lb

## 2021-01-19 DIAGNOSIS — D693 Immune thrombocytopenic purpura: Secondary | ICD-10-CM

## 2021-01-19 DIAGNOSIS — Z79899 Other long term (current) drug therapy: Secondary | ICD-10-CM | POA: Diagnosis not present

## 2021-01-19 DIAGNOSIS — F17210 Nicotine dependence, cigarettes, uncomplicated: Secondary | ICD-10-CM | POA: Insufficient documentation

## 2021-01-19 DIAGNOSIS — Z9081 Acquired absence of spleen: Secondary | ICD-10-CM | POA: Diagnosis not present

## 2021-01-19 LAB — CBC WITH DIFFERENTIAL/PLATELET
Abs Immature Granulocytes: 0.03 10*3/uL (ref 0.00–0.07)
Basophils Absolute: 0.1 10*3/uL (ref 0.0–0.1)
Basophils Relative: 1 %
Eosinophils Absolute: 0.7 10*3/uL — ABNORMAL HIGH (ref 0.0–0.5)
Eosinophils Relative: 5 %
HCT: 38.9 % (ref 36.0–46.0)
Hemoglobin: 12.2 g/dL (ref 12.0–15.0)
Immature Granulocytes: 0 %
Lymphocytes Relative: 39 %
Lymphs Abs: 5.3 10*3/uL — ABNORMAL HIGH (ref 0.7–4.0)
MCH: 26.7 pg (ref 26.0–34.0)
MCHC: 31.4 g/dL (ref 30.0–36.0)
MCV: 85.1 fL (ref 80.0–100.0)
Monocytes Absolute: 0.9 10*3/uL (ref 0.1–1.0)
Monocytes Relative: 7 %
Neutro Abs: 6.4 10*3/uL (ref 1.7–7.7)
Neutrophils Relative %: 48 %
Platelets: 124 10*3/uL — ABNORMAL LOW (ref 150–400)
RBC: 4.57 MIL/uL (ref 3.87–5.11)
RDW: 16.1 % — ABNORMAL HIGH (ref 11.5–15.5)
WBC: 13.4 10*3/uL — ABNORMAL HIGH (ref 4.0–10.5)
nRBC: 0 % (ref 0.0–0.2)

## 2021-01-22 ENCOUNTER — Telehealth: Payer: Self-pay | Admitting: Hematology

## 2021-01-22 NOTE — Telephone Encounter (Signed)
Left message with follow-up appointments per 5/20 los. Gave option to call back to reschedule if needed. 

## 2021-01-25 ENCOUNTER — Encounter: Payer: Self-pay | Admitting: Hematology

## 2021-02-19 ENCOUNTER — Encounter: Payer: Self-pay | Admitting: Hematology

## 2021-02-26 ENCOUNTER — Inpatient Hospital Stay: Payer: Self-pay | Attending: Hematology

## 2021-02-27 ENCOUNTER — Telehealth: Payer: Self-pay | Admitting: Hematology

## 2021-02-27 ENCOUNTER — Inpatient Hospital Stay: Payer: Self-pay | Admitting: Hematology

## 2021-02-27 NOTE — Telephone Encounter (Signed)
Left message with rescheduled appointments due to provider out of town.

## 2021-03-01 ENCOUNTER — Inpatient Hospital Stay: Payer: Self-pay

## 2021-03-01 ENCOUNTER — Telehealth: Payer: Self-pay | Admitting: Hematology

## 2021-03-01 NOTE — Progress Notes (Incomplete)
HEMATOLOGY/ONCOLOGY CONSULTATION NOTE  Date of Service: 03/01/2021  Patient Care Team: Patient, No Pcp Per (Inactive) as PCP - General (General Practice) Dr. Simone Curia - [facility]  CHIEF COMPLAINTS/PURPOSE OF CONSULTATION:  Chronic ITP  HISTORY OF PRESENTING ILLNESS:   Latoya Ayers is a wonderful 45 y.o. 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   Patient has a history of chronic ITP. She was diagnosed in 2008 and responded to steroids but relapsed with a taper. 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  Patient relapsed for the first time in late 2017 with platelets down to the 30k range and responded with improvement in platelet counts to more than 200k with dexamethasone. She has apparently had multiple treatments with dexamethasone in the last few years without durable response.  She underwent treatment with 4 doses of Rituxan from 3/18 through 12/07/2018 and was in remission for her chronic ITP till recently.  Patient presented to the emergency room at [facility] on 10/06/2020 as a transfer from [place] with 2 days of heavy menstrual vaginal bleeding, headaches, diffuse body aches,  fevers, night sweats and petechiae in the skin and oropharynx.  She was diagnosed with Covid infection in an unvaccinated situation.  Her platelet counts were noted to be 0. She was treated with Lysteda and dexamethasone 40 mg p.o. daily for 4 days. Her platelet counts after steroid dose levels were noted to be more than 100k. She did receive 1 unit of platelets. She did not receive IVIG. Hemoglobin levels were stable at 12.5.  Her chronic ITP relapse was thought to be related to active Covid infection. She did not receive any specific Covid related therapeutics. She did have a CT of the head which showed no intracranial bleeding.  She has  self-referred to be seen by [place] for continued management of her chronic ITP. She notes she was very anxious since her platelets dropped to 0 and there was concern for significant bleeding.  She notes that in the last few days she has been taking 20 mg of dexamethasone daily because she was anxious her platelets might have dropped again.  On review of systems, pt reports no further fevers chills or night sweats. No shortness of breath. Resolution of all her Covid symptoms. No new bleeding or bruising. No current vaginal bleeding. No epistaxis hemoptysis hematemesis or GI bleeding. No current headaches.  INTERVAL HISTORY I connected with Latoya Ayers on 03/02/2021 by telephone and verified that I am speaking with the correct person using two identifiers.   I discussed the limitations of evaluation and management by telemedicine. The patient expressed understanding and agreed to proceed.   Other persons participating in the visit and their role in the encounter:                                                         - Minda Meo, Medical Scribe     Patient's location: Home Provider's location: [facility]  Latoya Ayers is a wonderful 45 y.o. 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  The pt reports ***  Lab results *** of CBC w/diff and CMP is as follows: all values are WNL except for ***  On review of systems, pt reports *** and denies *** and any other symptoms.   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 Patient notes follicular adenoma removed in 2009. She has been on chronic levothyroxine replacement but her recent TSH was noted to be low at 0.068 and her levothyroxine dose was reduced to 150 mcg daily with a plan to follow-up with her primary care  physician.  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 Notes that she is up-to-date with her postsplenectomy vaccinations. Chronic smoker.   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  SOCIAL HISTORY: Social History   Socioeconomic History   Marital status: Married    Spouse name: Not on file   Number of children: Not on file   Years of education: Not on file   Highest education level: Not on file  Occupational History   Not on file  Tobacco Use   Smoking status: Every Day    Packs/day: 1.00    Pack years: 0.00    Types: Cigarettes   Smokeless tobacco: Never  Substance and Sexual Activity   Alcohol use: Yes    Comment: socially   Drug use: No   Sexual activity: Not on file  Other Topics Concern   Not on file  Social History Narrative   Not on file   Social Determinants of Health   Financial Resource Strain: Not on file  Food Insecurity: Not on file  Transportation Needs: Not on file  Physical Activity: Not on file  Stress: Not on file  Social Connections: Not on file  Intimate Partner Violence: Not on file  smoking  vaping 20 puffs a day suboxone  FAMILY HISTORY:  Breast cancer -- family hx  ALLERGIES:  is allergic to penicillins.    MEDICATIONS:  Current Outpatient Medications  Medication Sig Dispense Refill   albuterol (VENTOLIN HFA) 108 (90 Base) MCG/ACT inhaler Inhale 2 puffs into the lungs every 4 (four) hours as needed.     Buprenorphine HCl-Naloxone HCl 8-2 MG FILM Place under the tongue daily.     cyanocobalamin 1000 MCG tablet Take by mouth.     hydrochlorothiazide (MICROZIDE) 12.5 MG capsule Take by mouth.     levothyroxine (SYNTHROID) 150 MCG tablet Take 150 mcg by mouth.     Multiple Vitamin (MULTI-VITAMIN) tablet Take 1 tablet by mouth daily.     nicotine  (NICODERM CQ - DOSED IN MG/24 HOURS) 21 mg/24hr patch Place onto the skin. (Patient not taking: Reported on 10/23/2020)     phentermine 37.5 MG capsule Take by mouth.     sulfamethoxazole-trimethoprim (BACTRIM DS) 800-160 MG tablet Take 1 tablet by mouth daily. (Patient not taking: Reported on 10/23/2020)     tranexamic acid (LYSTEDA) 650 MG TABS tablet      No current facility-administered medications for this visit.    REVIEW OF SYSTEMS:   10 Point review of Systems was done is negative except as noted above.  PHYSICAL EXAMINATION: ECOG PERFORMANCE STATUS: 1 - Symptomatic but completely ambulatory  . There were no vitals filed for this visit.  There were no vitals filed for this visit.  .There is no height or weight on file to calculate BMI.  Telehealth Visit.  LABORATORY DATA:  I have reviewed the data as listed  . CBC Latest Ref Rng & Units 01/19/2021 11/20/2020 11/06/2020  WBC 4.0 - 10.5 K/uL 13.4(H) 13.7(H) 14.9(H)  Hemoglobin 12.0 - 15.0 g/dL 41.312.2 24.412.5 11.8(L)  Hematocrit 36.0 - 46.0 % 38.9 39.3 36.9  Platelets 150 - 400 K/uL 124(L) 534(H) 407(H)    . CMP Latest Ref Rng & Units 06/07/2016  Glucose 65 - 99 mg/dL 010(U165(H)  BUN 6 - 20 mg/dL <7(O<5(L)  Creatinine 5.360.44 - 1.00 mg/dL 6.440.98  Sodium 034135 - 742145 mmol/L 136  Potassium 3.5 - 5.1 mmol/L 4.0  Chloride 101 - 111 mmol/L 102  CO2 22 - 32 mmol/L 23  Calcium 8.9 - 10.3 mg/dL 5.9(D8.7(L)  Total Protein 6.5 - 8.1 g/dL 6.9  Total Bilirubin 0.3 - 1.2 mg/dL 0.7  Alkaline Phos 38 - 126 U/L 81  AST 15 - 41 U/L 28  ALT 14 - 54 U/L 18   Component     Latest Ref Rng & Units 10/23/2020  Iron     41 - 142 ug/dL 84  TIBC     638236 - 756444 ug/dL 433405  Saturation Ratios     21 - 57 % 21  UIBC     120 - 384 ug/dL 295321  Preg, Serum     NEGATIVE NEGATIVE  Vitamin B12     180 - 914 pg/mL 646  T4,Free(Direct)     0.61 - 1.12 ng/dL 1.881.11  TSH     4.1660.308 - 0.6303.960 uIU/mL 0.233 (L)  Ferritin     11 - 307 ng/mL 42  Immature Platelet Fraction      1.2 - 8.6 % 1.4    RADIOGRAPHIC STUDIES: I have personally reviewed the radiological images as listed and agreed with the findings in the report. No results found.  ASSESSMENT & PLAN:   45 year old female with history of chronic ITP with details as noted above with  1. Relapse of chronic ITP due to recent COVID-19 infection on 10/06/2020 with platelet counts of 0. She has had recent induction dose of dexamethasone 40 mg p.o. daily for 4 days. And herself decided to repeat dexamethasone 20 mg daily for additional 3 days until about 1 or 2 days ago.  Platelets today have bounced back to 757k. She has all been able to maintain durable response to steroids in the recent few years.   She was diagnosed in 2008 and responded to steroids but relapsed with a taper. 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  Patient relapsed for the first time in late 2017 with platelets down to the 30k range and responded with improvement in platelet counts to more than 200k with dexamethasone. She has apparently had multiple treatments with dexamethasone in the last few years without durable response.  She underwent treatment with 4 doses of Rituxan from 3/18 through 12/07/2018 and was in remission for her chronic ITP till recently.  PLAN: -Discussed pt labwork, ***    -Recommended pt continue to work on living an active lifestyle and eating healthier to work towards a more positive weight. -Advised pt to avoid any medications that can affect the Plt counts. -Will see back in ***  2. Iron deficiency anemia . Lab Results  Component Value Date   IRON 84 10/23/2020   TIBC  405 10/23/2020   IRONPCTSAT 21 10/23/2020   (Iron and TIBC)  Lab Results  Component Value Date   FERRITIN 42 10/23/2020   Plan: -Continue over-the-counter ferrous sulfate 325 mg p.o. daily  3. History of B12 deficiency B12 level today is at 646 PLan -Continue B12 1000 mcg p.o.  daily  4. Hypothyroidism-status post thyroidectomy in 2009 for follicular adenoma. Plan -On chronic levothyroxine. Dose recently reduced to 150 mcg at [facility] due to elevated TSH levels. Her TSH levels are moving towards normal. -She will continue to follow-up with her primary care physician to continue chronic thyroid replacement.  5. FHX of breast cancer in multiple relatives -We will refer to genetic counseling once her acute ITP relapse has stabilized.  6. History of opiate abuse currently on chronic opiate agonist therapy with Suboxone being managed by her primary care physician.  7. H/o Hidradenitis suppurative  FOLLOW UP: ***   All of the patients questions were answered with apparent satisfaction. The patient knows to call the clinic with any problems, questions or concerns.  The total time spent in the appointment was *** minutes and more than 50% was on counseling and direct patient cares.    Wyvonnia Lora MD MS AAHIVMS [facility] Hematology/Oncology Physician [facility]  (Office):       [phone] (Work cell):  [phone] (Fax):           [phone]  03/01/2021 6:56 PM  I, Minda Meo, am acting as scribe for Dr. Wyvonnia Lora, MD.

## 2021-03-01 NOTE — Telephone Encounter (Signed)
Cancelled appt per 6/29 sch msg. Called pt, no answer Left msg for pt to call back to r/s

## 2021-03-02 ENCOUNTER — Inpatient Hospital Stay: Payer: Self-pay | Attending: Hematology | Admitting: Hematology

## 2021-03-02 ENCOUNTER — Other Ambulatory Visit: Payer: Self-pay

## 2021-03-06 DIAGNOSIS — F1920 Other psychoactive substance dependence, uncomplicated: Secondary | ICD-10-CM | POA: Diagnosis not present

## 2021-03-06 DIAGNOSIS — E039 Hypothyroidism, unspecified: Secondary | ICD-10-CM | POA: Diagnosis not present

## 2021-03-06 DIAGNOSIS — F1120 Opioid dependence, uncomplicated: Secondary | ICD-10-CM | POA: Diagnosis not present

## 2021-03-06 DIAGNOSIS — F19980 Other psychoactive substance use, unspecified with psychoactive substance-induced anxiety disorder: Secondary | ICD-10-CM | POA: Diagnosis not present

## 2021-03-20 DIAGNOSIS — F1120 Opioid dependence, uncomplicated: Secondary | ICD-10-CM | POA: Diagnosis not present

## 2021-03-20 DIAGNOSIS — F19980 Other psychoactive substance use, unspecified with psychoactive substance-induced anxiety disorder: Secondary | ICD-10-CM | POA: Diagnosis not present

## 2021-03-20 DIAGNOSIS — F1920 Other psychoactive substance dependence, uncomplicated: Secondary | ICD-10-CM | POA: Diagnosis not present

## 2021-03-20 DIAGNOSIS — E039 Hypothyroidism, unspecified: Secondary | ICD-10-CM | POA: Diagnosis not present

## 2021-03-30 DIAGNOSIS — E039 Hypothyroidism, unspecified: Secondary | ICD-10-CM | POA: Diagnosis not present

## 2021-03-30 DIAGNOSIS — F1120 Opioid dependence, uncomplicated: Secondary | ICD-10-CM | POA: Diagnosis not present

## 2021-03-30 DIAGNOSIS — F19980 Other psychoactive substance use, unspecified with psychoactive substance-induced anxiety disorder: Secondary | ICD-10-CM | POA: Diagnosis not present

## 2021-03-30 DIAGNOSIS — F1920 Other psychoactive substance dependence, uncomplicated: Secondary | ICD-10-CM | POA: Diagnosis not present

## 2021-04-13 DIAGNOSIS — F19980 Other psychoactive substance use, unspecified with psychoactive substance-induced anxiety disorder: Secondary | ICD-10-CM | POA: Diagnosis not present

## 2021-04-13 DIAGNOSIS — E039 Hypothyroidism, unspecified: Secondary | ICD-10-CM | POA: Diagnosis not present

## 2021-04-13 DIAGNOSIS — F1120 Opioid dependence, uncomplicated: Secondary | ICD-10-CM | POA: Diagnosis not present

## 2021-04-13 DIAGNOSIS — F1920 Other psychoactive substance dependence, uncomplicated: Secondary | ICD-10-CM | POA: Diagnosis not present

## 2021-04-27 DIAGNOSIS — E039 Hypothyroidism, unspecified: Secondary | ICD-10-CM | POA: Diagnosis not present

## 2021-04-27 DIAGNOSIS — F19980 Other psychoactive substance use, unspecified with psychoactive substance-induced anxiety disorder: Secondary | ICD-10-CM | POA: Diagnosis not present

## 2021-04-27 DIAGNOSIS — F1120 Opioid dependence, uncomplicated: Secondary | ICD-10-CM | POA: Diagnosis not present

## 2021-04-27 DIAGNOSIS — F1920 Other psychoactive substance dependence, uncomplicated: Secondary | ICD-10-CM | POA: Diagnosis not present

## 2021-04-30 ENCOUNTER — Inpatient Hospital Stay: Payer: BC Managed Care – PPO | Attending: Hematology

## 2021-05-09 ENCOUNTER — Inpatient Hospital Stay: Payer: BC Managed Care – PPO | Attending: Hematology | Admitting: Hematology

## 2021-05-09 ENCOUNTER — Inpatient Hospital Stay: Payer: BC Managed Care – PPO

## 2021-05-09 ENCOUNTER — Other Ambulatory Visit: Payer: Self-pay

## 2021-05-09 ENCOUNTER — Encounter: Payer: Self-pay | Admitting: Hematology

## 2021-05-09 VITALS — BP 141/90 | HR 80 | Temp 98.8°F | Resp 18 | Wt 330.6 lb

## 2021-05-09 DIAGNOSIS — Z79891 Long term (current) use of opiate analgesic: Secondary | ICD-10-CM | POA: Insufficient documentation

## 2021-05-09 DIAGNOSIS — F17210 Nicotine dependence, cigarettes, uncomplicated: Secondary | ICD-10-CM | POA: Diagnosis not present

## 2021-05-09 DIAGNOSIS — D693 Immune thrombocytopenic purpura: Secondary | ICD-10-CM | POA: Insufficient documentation

## 2021-05-09 DIAGNOSIS — Z79899 Other long term (current) drug therapy: Secondary | ICD-10-CM | POA: Diagnosis not present

## 2021-05-09 DIAGNOSIS — E890 Postprocedural hypothyroidism: Secondary | ICD-10-CM | POA: Diagnosis not present

## 2021-05-09 DIAGNOSIS — D509 Iron deficiency anemia, unspecified: Secondary | ICD-10-CM | POA: Diagnosis not present

## 2021-05-09 LAB — CBC WITH DIFFERENTIAL/PLATELET
Abs Immature Granulocytes: 0.06 10*3/uL (ref 0.00–0.07)
Basophils Absolute: 0.1 10*3/uL (ref 0.0–0.1)
Basophils Relative: 1 %
Eosinophils Absolute: 1.1 10*3/uL — ABNORMAL HIGH (ref 0.0–0.5)
Eosinophils Relative: 6 %
HCT: 38.8 % (ref 36.0–46.0)
Hemoglobin: 12.3 g/dL (ref 12.0–15.0)
Immature Granulocytes: 0 %
Lymphocytes Relative: 29 %
Lymphs Abs: 5.1 10*3/uL — ABNORMAL HIGH (ref 0.7–4.0)
MCH: 27.3 pg (ref 26.0–34.0)
MCHC: 31.7 g/dL (ref 30.0–36.0)
MCV: 86.2 fL (ref 80.0–100.0)
Monocytes Absolute: 1.2 10*3/uL — ABNORMAL HIGH (ref 0.1–1.0)
Monocytes Relative: 7 %
Neutro Abs: 9.7 10*3/uL — ABNORMAL HIGH (ref 1.7–7.7)
Neutrophils Relative %: 57 %
Platelets: 245 10*3/uL (ref 150–400)
RBC: 4.5 MIL/uL (ref 3.87–5.11)
RDW: 15.7 % — ABNORMAL HIGH (ref 11.5–15.5)
WBC: 17.2 10*3/uL — ABNORMAL HIGH (ref 4.0–10.5)
nRBC: 0 % (ref 0.0–0.2)

## 2021-05-10 ENCOUNTER — Telehealth: Payer: Self-pay | Admitting: Hematology

## 2021-05-10 NOTE — Telephone Encounter (Signed)
Scheduled follow-up appointments per 9/7 los. Patient's husband is aware.

## 2021-05-11 DIAGNOSIS — F1920 Other psychoactive substance dependence, uncomplicated: Secondary | ICD-10-CM | POA: Diagnosis not present

## 2021-05-11 DIAGNOSIS — F1120 Opioid dependence, uncomplicated: Secondary | ICD-10-CM | POA: Diagnosis not present

## 2021-05-11 DIAGNOSIS — E039 Hypothyroidism, unspecified: Secondary | ICD-10-CM | POA: Diagnosis not present

## 2021-05-11 DIAGNOSIS — F19980 Other psychoactive substance use, unspecified with psychoactive substance-induced anxiety disorder: Secondary | ICD-10-CM | POA: Diagnosis not present

## 2021-05-15 ENCOUNTER — Encounter: Payer: Self-pay | Admitting: Hematology

## 2021-05-15 NOTE — Progress Notes (Signed)
HEMATOLOGY/ONCOLOGY CLINIC NOTE  Date of Service: .05/09/2021   Patient Care Team: Patient, No Pcp Per (Inactive) as PCP - General (General Practice) Dr. Simone Curia - [facility]  CHIEF COMPLAINTS/PURPOSE OF CONSULTATION:  Chronic ITP  HISTORY OF PRESENTING ILLNESS:   Latoya Ayers is a wonderful 45 y.o. 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   Patient has a history of chronic ITP. She was diagnosed in 2008 and responded to steroids but relapsed with a taper. 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  Patient relapsed for the first time in late 2017 with platelets down to the 30k range and responded with improvement in platelet counts to more than 200k with dexamethasone. She has apparently had multiple treatments with dexamethasone in the last few years without durable response.  She underwent treatment with 4 doses of Rituxan from 3/18 through 12/07/2018 and was in remission for her chronic ITP till recently.  Patient presented to the emergency room at [facility] on 10/06/2020 as a transfer from [facility] with 2 days of heavy menstrual vaginal bleeding, headaches, diffuse body aches,  fevers, night sweats and petechiae in the skin and oropharynx.  She was diagnosed with Covid infection in an unvaccinated situation.  Her platelet counts were noted to be 0. She was treated with Lysteda and dexamethasone 40 mg p.o. daily for 4 days. Her platelet counts after steroid dose levels were noted to be more than 100k. She did receive 1 unit of platelets. She did not receive IVIG. Hemoglobin levels were stable at 12.5.  Her chronic ITP relapse was thought to be related to active Covid infection. She did not receive any specific Covid related therapeutics. She did have a CT of the head which showed no intracranial bleeding.  She has  self-referred to be seen by [place] for continued management of her chronic ITP. She notes she was very anxious since her platelets dropped to 0 and there was concern for significant bleeding.  She notes that in the last few days she has been taking 20 mg of dexamethasone daily because she was anxious her platelets might have dropped again.  On review of systems, pt reports no further fevers chills or night sweats. No shortness of breath. Resolution of all her Covid symptoms. No new bleeding or bruising. No current vaginal bleeding. No epistaxis hemoptysis hematemesis or GI bleeding. No current headaches.  INTERVAL HISTORY  Latoya Ayers is a wonderful 45 y.o. 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 The pt reports that she is doing well overall.  The pt reports that she has had no new bleeding issues since her last visit.  No new medications.  . Lab results today 05/09/2021 of CBC w/diff platelets are within normal limits at 245k WBC counts 17.2k and normal hemoglobin of 12.3.  On review of systems, pt reports no acute new symptoms.  No bleeding issues.    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 Patient notes follicular adenoma removed in 2009. She has been on chronic levothyroxine replacement but her recent TSH was noted to be low at 0.068 and her levothyroxine dose was reduced to 150 mcg daily with a plan to  follow-up with her primary care physician.  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 Notes that she is up-to-date with her postsplenectomy vaccinations. Chronic smoker.   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  SOCIAL  HISTORY: Social History   Socioeconomic History   Marital status: Married    Spouse name: Not on file   Number of children: Not on file   Years of education: Not on file   Highest education level: Not on file  Occupational History   Not on file  Tobacco Use   Smoking status: Every Day    Packs/day: 1.00    Types: Cigarettes   Smokeless tobacco: Never  Substance and Sexual Activity   Alcohol use: Yes    Comment: socially   Drug use: No   Sexual activity: Not on file  Other Topics Concern   Not on file  Social History Narrative   Not on file   Social Determinants of Health   Financial Resource Strain: Not on file  Food Insecurity: Not on file  Transportation Needs: Not on file  Physical Activity: Not on file  Stress: Not on file  Social Connections: Not on file  Intimate Partner Violence: Not on file  smoking  vaping 20 puffs a day suboxone  FAMILY HISTORY:  Breast cancer -- family hx  ALLERGIES:  is allergic to penicillins.    MEDICATIONS:  Current Outpatient Medications  Medication Sig Dispense Refill   albuterol (VENTOLIN HFA) 108 (90 Base) MCG/ACT inhaler Inhale 2 puffs into the lungs every 4 (four) hours as needed.     Buprenorphine HCl-Naloxone HCl 8-2 MG FILM Place under the tongue daily.     levothyroxine (SYNTHROID) 150 MCG tablet Take 150 mcg by mouth.     Multiple Vitamin (MULTI-VITAMIN) tablet Take 1 tablet by mouth daily.     cyanocobalamin 1000 MCG tablet Take by mouth.     hydrochlorothiazide (MICROZIDE) 12.5 MG capsule Take by mouth.     nicotine (NICODERM CQ - DOSED IN MG/24 HOURS) 21 mg/24hr patch Place onto the skin.     phentermine 37.5 MG capsule Take by mouth.     sulfamethoxazole-trimethoprim (BACTRIM DS) 800-160 MG tablet Take 1 tablet by mouth daily.     tranexamic acid (LYSTEDA) 650 MG TABS tablet      No current facility-administered medications for this visit.    REVIEW OF SYSTEMS:   .10 Point review of Systems was done is  negative except as noted above.   PHYSICAL EXAMINATION: ECOG PERFORMANCE STATUS: 1 - Symptomatic but completely ambulatory  . Vitals:   05/09/21 1013  BP: (!) 141/90  Pulse: 80  Resp: 18  Temp: 98.8 F (37.1 C)  SpO2: 100%   Filed Weights   05/09/21 1013  Weight: (!) 330 lb 9.6 oz (150 kg)   .Body mass index is 56.75 kg/m. Marland Kitchen GENERAL:alert, in no acute distress and comfortable SKIN: no acute rashes, no significant lesions EYES: conjunctiva are pink and non-injected, sclera anicteric OROPHARYNX: MMM, no exudates, no oropharyngeal erythema or ulceration NECK: supple, no JVD LYMPH:  no palpable lymphadenopathy in the cervical, axillary or inguinal regions LUNGS: clear to auscultation b/l with normal respiratory effort HEART: regular rate & rhythm ABDOMEN:  normoactive bowel sounds , non tender, not distended. Extremity: no pedal edema PSYCH: alert & oriented x 3 with fluent  speech NEURO: no focal motor/sensory deficits  LABORATORY DATA:  I have reviewed the data as listed  . CBC Latest Ref Rng & Units 05/09/2021 01/19/2021 11/20/2020  WBC 4.0 - 10.5 K/uL 17.2(H) 13.4(H) 13.7(H)  Hemoglobin 12.0 - 15.0 g/dL 42.5 95.6 38.7  Hematocrit 36.0 - 46.0 % 38.8 38.9 39.3  Platelets 150 - 400 K/uL 245 124(L) 534(H)    . CMP Latest Ref Rng & Units 06/07/2016  Glucose 65 - 99 mg/dL 564(P)  BUN 6 - 20 mg/dL <3(I)  Creatinine 9.51 - 1.00 mg/dL 8.84  Sodium 166 - 063 mmol/L 136  Potassium 3.5 - 5.1 mmol/L 4.0  Chloride 101 - 111 mmol/L 102  CO2 22 - 32 mmol/L 23  Calcium 8.9 - 10.3 mg/dL 0.1(S)  Total Protein 6.5 - 8.1 g/dL 6.9  Total Bilirubin 0.3 - 1.2 mg/dL 0.7  Alkaline Phos 38 - 126 U/L 81  AST 15 - 41 U/L 28  ALT 14 - 54 U/L 18   Component     Latest Ref Rng & Units 10/23/2020  Iron     41 - 142 ug/dL 84  TIBC     010 - 932 ug/dL 355  Saturation Ratios     21 - 57 % 21  UIBC     120 - 384 ug/dL 732  Preg, Serum     NEGATIVE NEGATIVE  Vitamin B12     180 - 914  pg/mL 646  T4,Free(Direct)     0.61 - 1.12 ng/dL 2.02  TSH     5.427 - 0.623 uIU/mL 0.233 (L)  Ferritin     11 - 307 ng/mL 42  Immature Platelet Fraction     1.2 - 8.6 % 1.4    RADIOGRAPHIC STUDIES: I have personally reviewed the radiological images as listed and agreed with the findings in the report. No results found.  ASSESSMENT & PLAN:   45 year old female with history of chronic ITP with details as noted above with  1. S/p Relapse of chronic ITP due to recent COVID-19 infection on 10/06/2020 with platelet counts of 0. She has had recent induction dose of dexamethasone 40 mg p.o. daily for 4 days. And herself decided to repeat dexamethasone 20 mg daily for additional 3 days until about 1 or 2 days ago.  Platelets today have bounced back to 757k. She has all been able to maintain durable response to steroids in the recent few years.   She was diagnosed in 2008 and responded to steroids but relapsed with a taper. 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  Patient relapsed for the first time in late 2017 with platelets down to the 30k range and responded with improvement in platelet counts to more than 200k with dexamethasone. She has apparently had multiple treatments with dexamethasone in the last few years without durable response.  She underwent treatment with 4 doses of Rituxan from 3/18 through 12/07/2018 and was in remission for her chronic ITP till recently.  PLAN: -Discussed pt labwork today, 05/09/2021; Plt wnl at 245k -Recommended pt avoid NSAIDs except Tylenol. -Advised pt to be careful with taking supplements. -Recommended pt continue to work on living an active lifestyle and eating healthier to work towards a more positive weight. -Advised pt to avoid any medications that can affect the Plt counts. -Continue to monitor for relapse of ITP. -No indication for additional treatment for her ITP at this time  2. Iron  deficiency anemia .  Lab Results  Component Value Date   IRON 84 10/23/2020   TIBC 405 10/23/2020   IRONPCTSAT 21 10/23/2020   (Iron and TIBC)  Lab Results  Component Value Date   FERRITIN 42 10/23/2020   Plan: -Continue over-the-counter ferrous sulfate 325 mg p.o. daily  3. History of B12 deficiency B12 level today is at 646 PLan -Continue B12 1000 mcg p.o. daily  4. Hypothyroidism-status post thyroidectomy in 2009 for follicular adenoma. Plan -On chronic levothyroxine. Dose recently reduced to 150 mcg at [facility] due to elevated TSH levels. Her TSH levels are moving towards normal. -She will continue to follow-up with her primary care physician to continue chronic thyroid replacement.  5. FHX of breast cancer in multiple relatives -We will refer to genetic counseling discussed patient wants to hold off at this time.  6. History of opiate abuse currently on chronic opiate agonist therapy with Suboxone being managed by her primary care physician.  7. 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 Candise Che in 3 months Labs 1 day prior to phone visit   All of the patients questions were answered with apparent satisfaction. The patient knows to call the clinic with any problems, questions or concerns.  . The total time spent in the appointment was 20 minutes and more than 50% was on counseling and direct patient cares.     Wyvonnia Lora MD MS AAHIVMS [facility] Hematology/Oncology Physician [facility]  (Office):       [phone] (Work cell):  [phone] (Fax):           [phone]

## 2021-05-25 DIAGNOSIS — F19980 Other psychoactive substance use, unspecified with psychoactive substance-induced anxiety disorder: Secondary | ICD-10-CM | POA: Diagnosis not present

## 2021-05-25 DIAGNOSIS — E039 Hypothyroidism, unspecified: Secondary | ICD-10-CM | POA: Diagnosis not present

## 2021-05-25 DIAGNOSIS — F1920 Other psychoactive substance dependence, uncomplicated: Secondary | ICD-10-CM | POA: Diagnosis not present

## 2021-05-25 DIAGNOSIS — F1120 Opioid dependence, uncomplicated: Secondary | ICD-10-CM | POA: Diagnosis not present

## 2021-06-08 DIAGNOSIS — F1120 Opioid dependence, uncomplicated: Secondary | ICD-10-CM | POA: Diagnosis not present

## 2021-06-08 DIAGNOSIS — E039 Hypothyroidism, unspecified: Secondary | ICD-10-CM | POA: Diagnosis not present

## 2021-06-08 DIAGNOSIS — F19980 Other psychoactive substance use, unspecified with psychoactive substance-induced anxiety disorder: Secondary | ICD-10-CM | POA: Diagnosis not present

## 2021-06-08 DIAGNOSIS — F1920 Other psychoactive substance dependence, uncomplicated: Secondary | ICD-10-CM | POA: Diagnosis not present

## 2021-06-22 DIAGNOSIS — F19980 Other psychoactive substance use, unspecified with psychoactive substance-induced anxiety disorder: Secondary | ICD-10-CM | POA: Diagnosis not present

## 2021-06-22 DIAGNOSIS — F1920 Other psychoactive substance dependence, uncomplicated: Secondary | ICD-10-CM | POA: Diagnosis not present

## 2021-06-22 DIAGNOSIS — E039 Hypothyroidism, unspecified: Secondary | ICD-10-CM | POA: Diagnosis not present

## 2021-06-22 DIAGNOSIS — F1120 Opioid dependence, uncomplicated: Secondary | ICD-10-CM | POA: Diagnosis not present

## 2021-07-06 DIAGNOSIS — F1920 Other psychoactive substance dependence, uncomplicated: Secondary | ICD-10-CM | POA: Diagnosis not present

## 2021-07-06 DIAGNOSIS — F19980 Other psychoactive substance use, unspecified with psychoactive substance-induced anxiety disorder: Secondary | ICD-10-CM | POA: Diagnosis not present

## 2021-07-06 DIAGNOSIS — E039 Hypothyroidism, unspecified: Secondary | ICD-10-CM | POA: Diagnosis not present

## 2021-07-06 DIAGNOSIS — F1120 Opioid dependence, uncomplicated: Secondary | ICD-10-CM | POA: Diagnosis not present

## 2021-07-20 DIAGNOSIS — F1120 Opioid dependence, uncomplicated: Secondary | ICD-10-CM | POA: Diagnosis not present

## 2021-07-20 DIAGNOSIS — F1920 Other psychoactive substance dependence, uncomplicated: Secondary | ICD-10-CM | POA: Diagnosis not present

## 2021-07-20 DIAGNOSIS — F19980 Other psychoactive substance use, unspecified with psychoactive substance-induced anxiety disorder: Secondary | ICD-10-CM | POA: Diagnosis not present

## 2021-07-20 DIAGNOSIS — E039 Hypothyroidism, unspecified: Secondary | ICD-10-CM | POA: Diagnosis not present

## 2021-08-07 ENCOUNTER — Inpatient Hospital Stay: Payer: BC Managed Care – PPO

## 2021-08-08 ENCOUNTER — Inpatient Hospital Stay: Payer: BC Managed Care – PPO | Admitting: Hematology

## 2021-08-17 DIAGNOSIS — E039 Hypothyroidism, unspecified: Secondary | ICD-10-CM | POA: Diagnosis not present

## 2021-08-17 DIAGNOSIS — F1120 Opioid dependence, uncomplicated: Secondary | ICD-10-CM | POA: Diagnosis not present

## 2021-08-17 DIAGNOSIS — F19980 Other psychoactive substance use, unspecified with psychoactive substance-induced anxiety disorder: Secondary | ICD-10-CM | POA: Diagnosis not present

## 2021-08-17 DIAGNOSIS — F1920 Other psychoactive substance dependence, uncomplicated: Secondary | ICD-10-CM | POA: Diagnosis not present

## 2021-08-30 ENCOUNTER — Inpatient Hospital Stay: Payer: BC Managed Care – PPO | Admitting: Hematology

## 2021-08-30 ENCOUNTER — Inpatient Hospital Stay: Payer: BC Managed Care – PPO

## 2021-08-31 DIAGNOSIS — E039 Hypothyroidism, unspecified: Secondary | ICD-10-CM | POA: Diagnosis not present

## 2021-08-31 DIAGNOSIS — Z1239 Encounter for other screening for malignant neoplasm of breast: Secondary | ICD-10-CM | POA: Diagnosis not present

## 2021-08-31 DIAGNOSIS — Z0000 Encounter for general adult medical examination without abnormal findings: Secondary | ICD-10-CM | POA: Diagnosis not present

## 2021-08-31 DIAGNOSIS — Z0001 Encounter for general adult medical examination with abnormal findings: Secondary | ICD-10-CM | POA: Diagnosis not present

## 2021-08-31 DIAGNOSIS — Z23 Encounter for immunization: Secondary | ICD-10-CM | POA: Diagnosis not present

## 2021-08-31 DIAGNOSIS — R8279 Other abnormal findings on microbiological examination of urine: Secondary | ICD-10-CM | POA: Diagnosis not present

## 2021-08-31 DIAGNOSIS — Z136 Encounter for screening for cardiovascular disorders: Secondary | ICD-10-CM | POA: Diagnosis not present

## 2021-08-31 DIAGNOSIS — F32A Depression, unspecified: Secondary | ICD-10-CM | POA: Diagnosis not present

## 2021-08-31 DIAGNOSIS — Z124 Encounter for screening for malignant neoplasm of cervix: Secondary | ICD-10-CM | POA: Diagnosis not present

## 2021-08-31 DIAGNOSIS — D693 Immune thrombocytopenic purpura: Secondary | ICD-10-CM | POA: Diagnosis not present

## 2021-08-31 DIAGNOSIS — G4733 Obstructive sleep apnea (adult) (pediatric): Secondary | ICD-10-CM | POA: Diagnosis not present

## 2021-09-04 ENCOUNTER — Other Ambulatory Visit: Payer: Self-pay | Admitting: Family Medicine

## 2021-09-04 DIAGNOSIS — R8279 Other abnormal findings on microbiological examination of urine: Secondary | ICD-10-CM | POA: Diagnosis not present

## 2021-09-04 DIAGNOSIS — M7989 Other specified soft tissue disorders: Secondary | ICD-10-CM

## 2021-09-04 DIAGNOSIS — Z136 Encounter for screening for cardiovascular disorders: Secondary | ICD-10-CM | POA: Diagnosis not present

## 2021-09-04 DIAGNOSIS — E039 Hypothyroidism, unspecified: Secondary | ICD-10-CM | POA: Diagnosis not present

## 2021-09-06 ENCOUNTER — Other Ambulatory Visit: Payer: Self-pay | Admitting: Internal Medicine

## 2021-09-06 DIAGNOSIS — Z1231 Encounter for screening mammogram for malignant neoplasm of breast: Secondary | ICD-10-CM

## 2021-09-11 ENCOUNTER — Other Ambulatory Visit: Payer: BC Managed Care – PPO

## 2021-09-14 DIAGNOSIS — F19980 Other psychoactive substance use, unspecified with psychoactive substance-induced anxiety disorder: Secondary | ICD-10-CM | POA: Diagnosis not present

## 2021-09-14 DIAGNOSIS — E039 Hypothyroidism, unspecified: Secondary | ICD-10-CM | POA: Diagnosis not present

## 2021-09-14 DIAGNOSIS — F1920 Other psychoactive substance dependence, uncomplicated: Secondary | ICD-10-CM | POA: Diagnosis not present

## 2021-09-14 DIAGNOSIS — F1120 Opioid dependence, uncomplicated: Secondary | ICD-10-CM | POA: Diagnosis not present

## 2021-09-20 ENCOUNTER — Ambulatory Visit: Payer: BC Managed Care – PPO

## 2021-09-21 ENCOUNTER — Ambulatory Visit
Admission: RE | Admit: 2021-09-21 | Discharge: 2021-09-21 | Disposition: A | Discharge status: Home / Self Care | Payer: BC Managed Care – PPO | Source: Ambulatory Visit | Attending: Internal Medicine | Admitting: Internal Medicine

## 2021-09-21 ENCOUNTER — Encounter: Payer: Self-pay | Admitting: Hematology

## 2021-09-21 DIAGNOSIS — Z1231 Encounter for screening mammogram for malignant neoplasm of breast: Secondary | ICD-10-CM | POA: Diagnosis not present

## 2021-10-12 DIAGNOSIS — E039 Hypothyroidism, unspecified: Secondary | ICD-10-CM | POA: Diagnosis not present

## 2021-10-12 DIAGNOSIS — F19980 Other psychoactive substance use, unspecified with psychoactive substance-induced anxiety disorder: Secondary | ICD-10-CM | POA: Diagnosis not present

## 2021-10-12 DIAGNOSIS — F1920 Other psychoactive substance dependence, uncomplicated: Secondary | ICD-10-CM | POA: Diagnosis not present

## 2021-10-12 DIAGNOSIS — F1120 Opioid dependence, uncomplicated: Secondary | ICD-10-CM | POA: Diagnosis not present

## 2021-11-09 DIAGNOSIS — F19980 Other psychoactive substance use, unspecified with psychoactive substance-induced anxiety disorder: Secondary | ICD-10-CM | POA: Diagnosis not present

## 2021-11-09 DIAGNOSIS — E039 Hypothyroidism, unspecified: Secondary | ICD-10-CM | POA: Diagnosis not present

## 2021-11-09 DIAGNOSIS — F1120 Opioid dependence, uncomplicated: Secondary | ICD-10-CM | POA: Diagnosis not present

## 2021-11-09 DIAGNOSIS — F1920 Other psychoactive substance dependence, uncomplicated: Secondary | ICD-10-CM | POA: Diagnosis not present

## 2021-11-23 DIAGNOSIS — E039 Hypothyroidism, unspecified: Secondary | ICD-10-CM | POA: Diagnosis not present

## 2021-11-23 DIAGNOSIS — F19980 Other psychoactive substance use, unspecified with psychoactive substance-induced anxiety disorder: Secondary | ICD-10-CM | POA: Diagnosis not present

## 2021-11-23 DIAGNOSIS — F1120 Opioid dependence, uncomplicated: Secondary | ICD-10-CM | POA: Diagnosis not present

## 2021-11-23 DIAGNOSIS — F1920 Other psychoactive substance dependence, uncomplicated: Secondary | ICD-10-CM | POA: Diagnosis not present

## 2021-12-21 DIAGNOSIS — F1920 Other psychoactive substance dependence, uncomplicated: Secondary | ICD-10-CM | POA: Diagnosis not present

## 2021-12-21 DIAGNOSIS — F19980 Other psychoactive substance use, unspecified with psychoactive substance-induced anxiety disorder: Secondary | ICD-10-CM | POA: Diagnosis not present

## 2021-12-21 DIAGNOSIS — F1120 Opioid dependence, uncomplicated: Secondary | ICD-10-CM | POA: Diagnosis not present

## 2021-12-21 DIAGNOSIS — E039 Hypothyroidism, unspecified: Secondary | ICD-10-CM | POA: Diagnosis not present

## 2022-01-01 DIAGNOSIS — F19980 Other psychoactive substance use, unspecified with psychoactive substance-induced anxiety disorder: Secondary | ICD-10-CM | POA: Diagnosis not present

## 2022-01-01 DIAGNOSIS — E039 Hypothyroidism, unspecified: Secondary | ICD-10-CM | POA: Diagnosis not present

## 2022-01-01 DIAGNOSIS — F1120 Opioid dependence, uncomplicated: Secondary | ICD-10-CM | POA: Diagnosis not present

## 2022-01-01 DIAGNOSIS — F1920 Other psychoactive substance dependence, uncomplicated: Secondary | ICD-10-CM | POA: Diagnosis not present

## 2022-01-08 DIAGNOSIS — F19980 Other psychoactive substance use, unspecified with psychoactive substance-induced anxiety disorder: Secondary | ICD-10-CM | POA: Diagnosis not present

## 2022-01-08 DIAGNOSIS — E039 Hypothyroidism, unspecified: Secondary | ICD-10-CM | POA: Diagnosis not present

## 2022-01-08 DIAGNOSIS — F1120 Opioid dependence, uncomplicated: Secondary | ICD-10-CM | POA: Diagnosis not present

## 2022-01-08 DIAGNOSIS — F1920 Other psychoactive substance dependence, uncomplicated: Secondary | ICD-10-CM | POA: Diagnosis not present

## 2022-01-14 DIAGNOSIS — F1920 Other psychoactive substance dependence, uncomplicated: Secondary | ICD-10-CM | POA: Diagnosis not present

## 2022-01-14 DIAGNOSIS — F1120 Opioid dependence, uncomplicated: Secondary | ICD-10-CM | POA: Diagnosis not present

## 2022-01-14 DIAGNOSIS — E039 Hypothyroidism, unspecified: Secondary | ICD-10-CM | POA: Diagnosis not present

## 2022-01-14 DIAGNOSIS — F19980 Other psychoactive substance use, unspecified with psychoactive substance-induced anxiety disorder: Secondary | ICD-10-CM | POA: Diagnosis not present

## 2022-01-21 DIAGNOSIS — F1120 Opioid dependence, uncomplicated: Secondary | ICD-10-CM | POA: Diagnosis not present

## 2022-01-21 DIAGNOSIS — F19980 Other psychoactive substance use, unspecified with psychoactive substance-induced anxiety disorder: Secondary | ICD-10-CM | POA: Diagnosis not present

## 2022-01-21 DIAGNOSIS — F1920 Other psychoactive substance dependence, uncomplicated: Secondary | ICD-10-CM | POA: Diagnosis not present

## 2022-01-21 DIAGNOSIS — E039 Hypothyroidism, unspecified: Secondary | ICD-10-CM | POA: Diagnosis not present

## 2022-01-29 DIAGNOSIS — F19980 Other psychoactive substance use, unspecified with psychoactive substance-induced anxiety disorder: Secondary | ICD-10-CM | POA: Diagnosis not present

## 2022-01-29 DIAGNOSIS — E039 Hypothyroidism, unspecified: Secondary | ICD-10-CM | POA: Diagnosis not present

## 2022-01-29 DIAGNOSIS — F1120 Opioid dependence, uncomplicated: Secondary | ICD-10-CM | POA: Diagnosis not present

## 2022-01-29 DIAGNOSIS — F1920 Other psychoactive substance dependence, uncomplicated: Secondary | ICD-10-CM | POA: Diagnosis not present

## 2022-02-05 DIAGNOSIS — F19980 Other psychoactive substance use, unspecified with psychoactive substance-induced anxiety disorder: Secondary | ICD-10-CM | POA: Diagnosis not present

## 2022-02-05 DIAGNOSIS — F1120 Opioid dependence, uncomplicated: Secondary | ICD-10-CM | POA: Diagnosis not present

## 2022-02-05 DIAGNOSIS — E039 Hypothyroidism, unspecified: Secondary | ICD-10-CM | POA: Diagnosis not present

## 2022-02-05 DIAGNOSIS — F1920 Other psychoactive substance dependence, uncomplicated: Secondary | ICD-10-CM | POA: Diagnosis not present

## 2022-02-18 DIAGNOSIS — F1120 Opioid dependence, uncomplicated: Secondary | ICD-10-CM | POA: Diagnosis not present

## 2022-02-18 DIAGNOSIS — F19980 Other psychoactive substance use, unspecified with psychoactive substance-induced anxiety disorder: Secondary | ICD-10-CM | POA: Diagnosis not present

## 2022-02-18 DIAGNOSIS — F1920 Other psychoactive substance dependence, uncomplicated: Secondary | ICD-10-CM | POA: Diagnosis not present

## 2022-02-18 DIAGNOSIS — E039 Hypothyroidism, unspecified: Secondary | ICD-10-CM | POA: Diagnosis not present

## 2022-03-04 DIAGNOSIS — F1120 Opioid dependence, uncomplicated: Secondary | ICD-10-CM | POA: Diagnosis not present

## 2022-03-04 DIAGNOSIS — E039 Hypothyroidism, unspecified: Secondary | ICD-10-CM | POA: Diagnosis not present

## 2022-03-04 DIAGNOSIS — F1920 Other psychoactive substance dependence, uncomplicated: Secondary | ICD-10-CM | POA: Diagnosis not present

## 2022-03-18 DIAGNOSIS — Z6841 Body Mass Index (BMI) 40.0 and over, adult: Secondary | ICD-10-CM | POA: Diagnosis not present

## 2022-03-18 DIAGNOSIS — F1120 Opioid dependence, uncomplicated: Secondary | ICD-10-CM | POA: Diagnosis not present

## 2022-03-18 DIAGNOSIS — F1920 Other psychoactive substance dependence, uncomplicated: Secondary | ICD-10-CM | POA: Diagnosis not present

## 2022-03-18 DIAGNOSIS — E039 Hypothyroidism, unspecified: Secondary | ICD-10-CM | POA: Diagnosis not present

## 2022-04-01 DIAGNOSIS — Z6841 Body Mass Index (BMI) 40.0 and over, adult: Secondary | ICD-10-CM | POA: Diagnosis not present

## 2022-04-01 DIAGNOSIS — F1920 Other psychoactive substance dependence, uncomplicated: Secondary | ICD-10-CM | POA: Diagnosis not present

## 2022-04-01 DIAGNOSIS — E039 Hypothyroidism, unspecified: Secondary | ICD-10-CM | POA: Diagnosis not present

## 2022-04-01 DIAGNOSIS — F1120 Opioid dependence, uncomplicated: Secondary | ICD-10-CM | POA: Diagnosis not present

## 2022-04-30 DIAGNOSIS — F1120 Opioid dependence, uncomplicated: Secondary | ICD-10-CM | POA: Diagnosis not present

## 2022-04-30 DIAGNOSIS — F419 Anxiety disorder, unspecified: Secondary | ICD-10-CM | POA: Diagnosis not present

## 2022-04-30 DIAGNOSIS — R5382 Chronic fatigue, unspecified: Secondary | ICD-10-CM | POA: Diagnosis not present

## 2022-04-30 DIAGNOSIS — F1920 Other psychoactive substance dependence, uncomplicated: Secondary | ICD-10-CM | POA: Diagnosis not present

## 2022-05-07 DIAGNOSIS — E039 Hypothyroidism, unspecified: Secondary | ICD-10-CM | POA: Diagnosis not present

## 2022-05-07 DIAGNOSIS — F419 Anxiety disorder, unspecified: Secondary | ICD-10-CM | POA: Diagnosis not present

## 2022-05-07 DIAGNOSIS — F1920 Other psychoactive substance dependence, uncomplicated: Secondary | ICD-10-CM | POA: Diagnosis not present

## 2022-05-07 DIAGNOSIS — F1120 Opioid dependence, uncomplicated: Secondary | ICD-10-CM | POA: Diagnosis not present

## 2022-05-15 DIAGNOSIS — E039 Hypothyroidism, unspecified: Secondary | ICD-10-CM | POA: Diagnosis not present

## 2022-05-15 DIAGNOSIS — F419 Anxiety disorder, unspecified: Secondary | ICD-10-CM | POA: Diagnosis not present

## 2022-05-15 DIAGNOSIS — F1120 Opioid dependence, uncomplicated: Secondary | ICD-10-CM | POA: Diagnosis not present

## 2022-05-15 DIAGNOSIS — F1920 Other psychoactive substance dependence, uncomplicated: Secondary | ICD-10-CM | POA: Diagnosis not present

## 2022-05-29 DIAGNOSIS — F419 Anxiety disorder, unspecified: Secondary | ICD-10-CM | POA: Diagnosis not present

## 2022-05-29 DIAGNOSIS — F1120 Opioid dependence, uncomplicated: Secondary | ICD-10-CM | POA: Diagnosis not present

## 2022-05-29 DIAGNOSIS — E039 Hypothyroidism, unspecified: Secondary | ICD-10-CM | POA: Diagnosis not present

## 2022-05-29 DIAGNOSIS — F1920 Other psychoactive substance dependence, uncomplicated: Secondary | ICD-10-CM | POA: Diagnosis not present

## 2022-10-02 DIAGNOSIS — R110 Nausea: Secondary | ICD-10-CM | POA: Diagnosis not present

## 2022-10-02 DIAGNOSIS — F1120 Opioid dependence, uncomplicated: Secondary | ICD-10-CM | POA: Diagnosis not present

## 2022-10-02 DIAGNOSIS — F419 Anxiety disorder, unspecified: Secondary | ICD-10-CM | POA: Diagnosis not present

## 2022-10-02 DIAGNOSIS — F1920 Other psychoactive substance dependence, uncomplicated: Secondary | ICD-10-CM | POA: Diagnosis not present

## 2022-10-16 DIAGNOSIS — F1920 Other psychoactive substance dependence, uncomplicated: Secondary | ICD-10-CM | POA: Diagnosis not present

## 2022-10-16 DIAGNOSIS — R110 Nausea: Secondary | ICD-10-CM | POA: Diagnosis not present

## 2022-10-16 DIAGNOSIS — E039 Hypothyroidism, unspecified: Secondary | ICD-10-CM | POA: Diagnosis not present

## 2022-10-16 DIAGNOSIS — F1120 Opioid dependence, uncomplicated: Secondary | ICD-10-CM | POA: Diagnosis not present

## 2022-10-16 DIAGNOSIS — F419 Anxiety disorder, unspecified: Secondary | ICD-10-CM | POA: Diagnosis not present

## 2022-10-30 DIAGNOSIS — R110 Nausea: Secondary | ICD-10-CM | POA: Diagnosis not present

## 2022-10-30 DIAGNOSIS — F419 Anxiety disorder, unspecified: Secondary | ICD-10-CM | POA: Diagnosis not present

## 2022-10-30 DIAGNOSIS — F1920 Other psychoactive substance dependence, uncomplicated: Secondary | ICD-10-CM | POA: Diagnosis not present

## 2022-10-30 DIAGNOSIS — F1120 Opioid dependence, uncomplicated: Secondary | ICD-10-CM | POA: Diagnosis not present

## 2022-11-13 DIAGNOSIS — F1920 Other psychoactive substance dependence, uncomplicated: Secondary | ICD-10-CM | POA: Diagnosis not present

## 2022-11-13 DIAGNOSIS — F419 Anxiety disorder, unspecified: Secondary | ICD-10-CM | POA: Diagnosis not present

## 2022-11-13 DIAGNOSIS — R110 Nausea: Secondary | ICD-10-CM | POA: Diagnosis not present

## 2022-11-13 DIAGNOSIS — F1120 Opioid dependence, uncomplicated: Secondary | ICD-10-CM | POA: Diagnosis not present

## 2022-11-27 DIAGNOSIS — F1920 Other psychoactive substance dependence, uncomplicated: Secondary | ICD-10-CM | POA: Diagnosis not present

## 2022-11-27 DIAGNOSIS — R110 Nausea: Secondary | ICD-10-CM | POA: Diagnosis not present

## 2022-11-27 DIAGNOSIS — F1120 Opioid dependence, uncomplicated: Secondary | ICD-10-CM | POA: Diagnosis not present

## 2022-11-27 DIAGNOSIS — B001 Herpesviral vesicular dermatitis: Secondary | ICD-10-CM | POA: Diagnosis not present

## 2022-12-11 DIAGNOSIS — F1920 Other psychoactive substance dependence, uncomplicated: Secondary | ICD-10-CM | POA: Diagnosis not present

## 2022-12-11 DIAGNOSIS — F419 Anxiety disorder, unspecified: Secondary | ICD-10-CM | POA: Diagnosis not present

## 2022-12-11 DIAGNOSIS — R110 Nausea: Secondary | ICD-10-CM | POA: Diagnosis not present

## 2022-12-11 DIAGNOSIS — F1120 Opioid dependence, uncomplicated: Secondary | ICD-10-CM | POA: Diagnosis not present

## 2022-12-12 IMAGING — MG MM DIGITAL SCREENING BILAT W/ TOMO AND CAD
8 of 14 series · 8 of 40 positions shown · non-contrast
Comparison: None.

CLINICAL DATA: Screening.

EXAM:
DIGITAL SCREENING BILATERAL MAMMOGRAM WITH TOMOSYNTHESIS AND CAD
TECHNIQUE: Bilateral screening digital craniocaudal and mediolateral oblique
mammograms were obtained. Bilateral screening digital breast
tomosynthesis was performed. The images were evaluated with
computer-aided detection.

[R MLO synth-2D (1 of 2)]
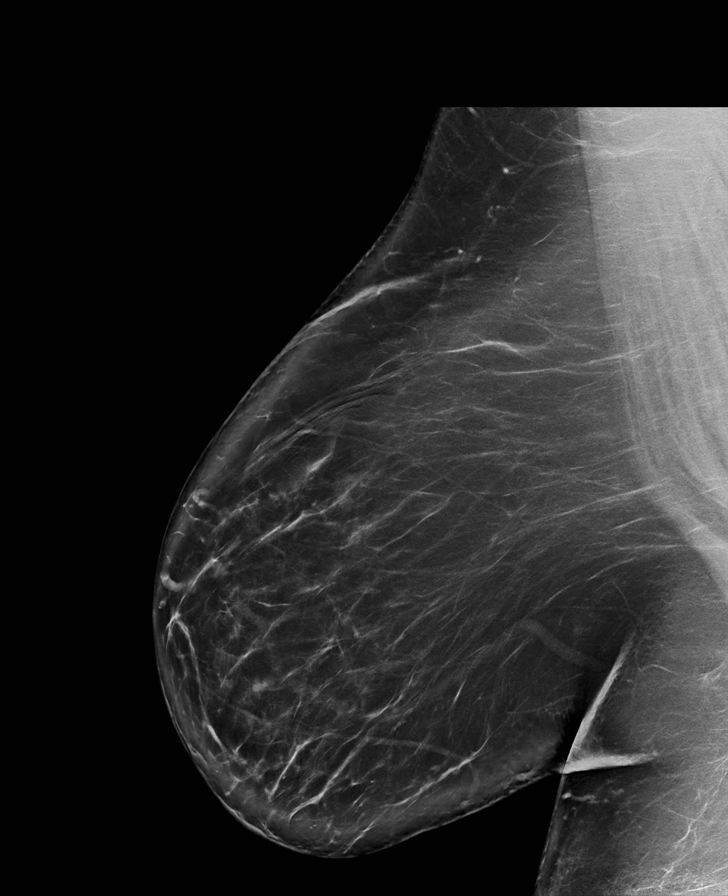

[R MLO synth-2D (2 of 2)]
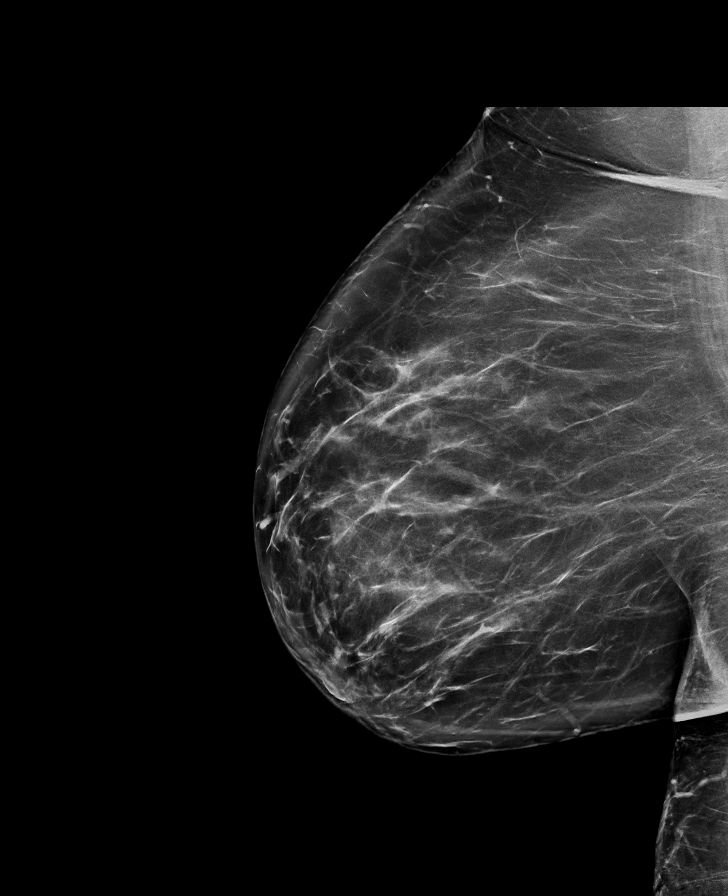

[L MLO synth-2D (1 of 2)]
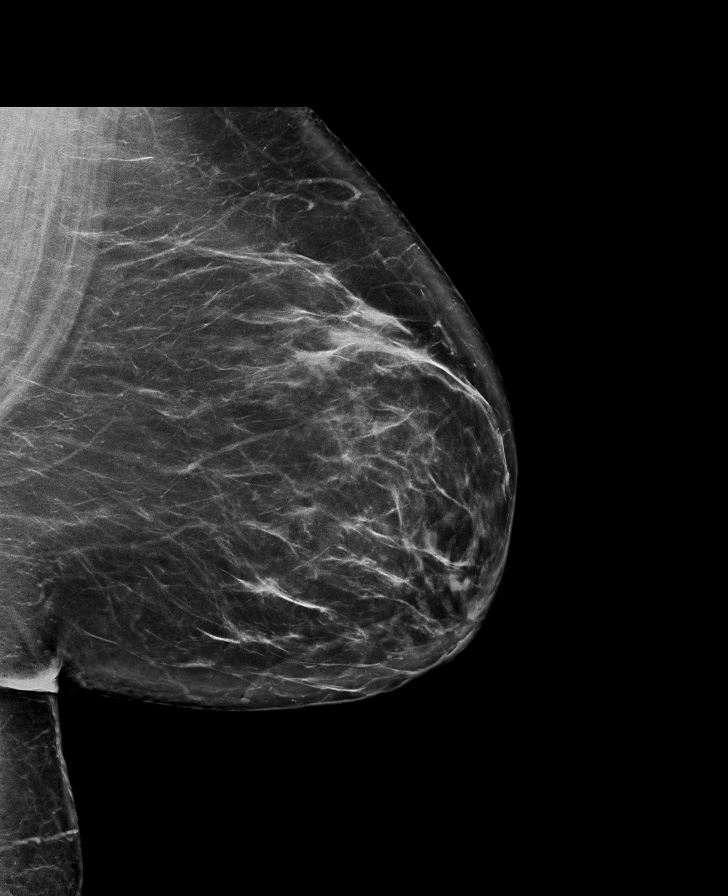

[L CC synth-2D]
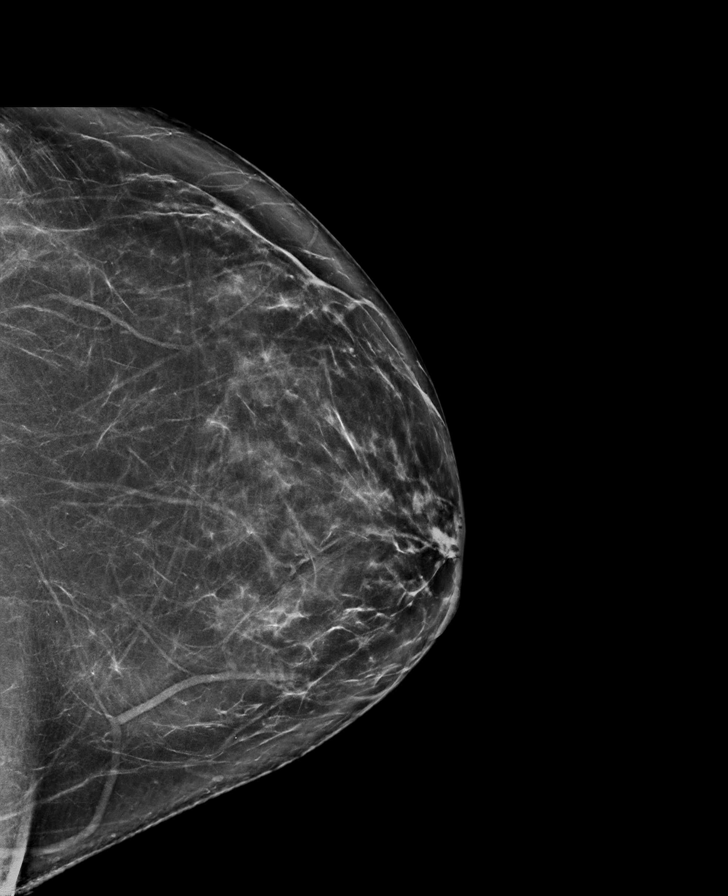

[R CC synth-2D (1 of 2)]
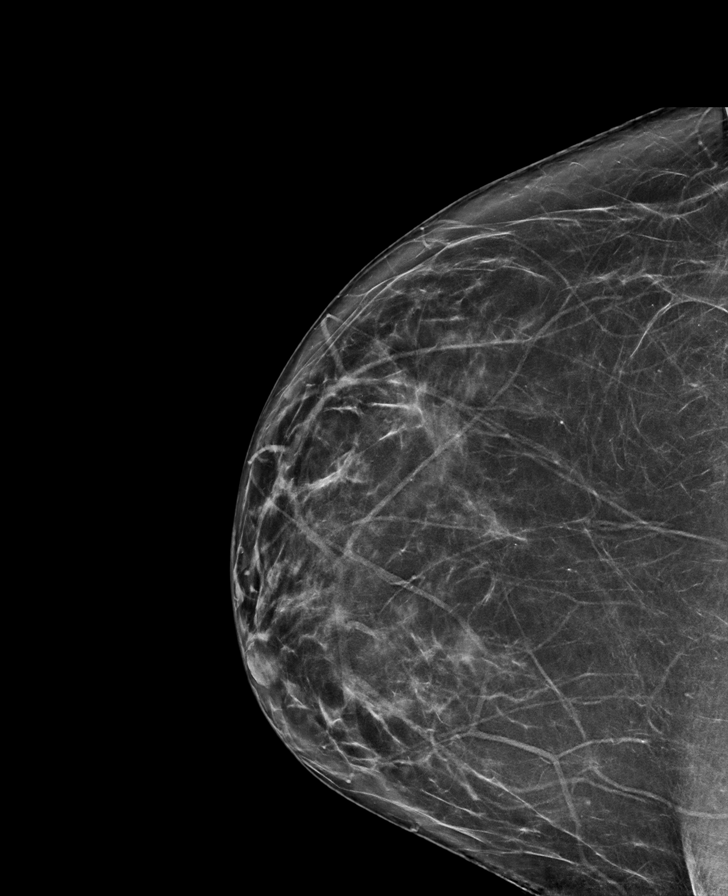

[R CC synth-2D (2 of 2)]
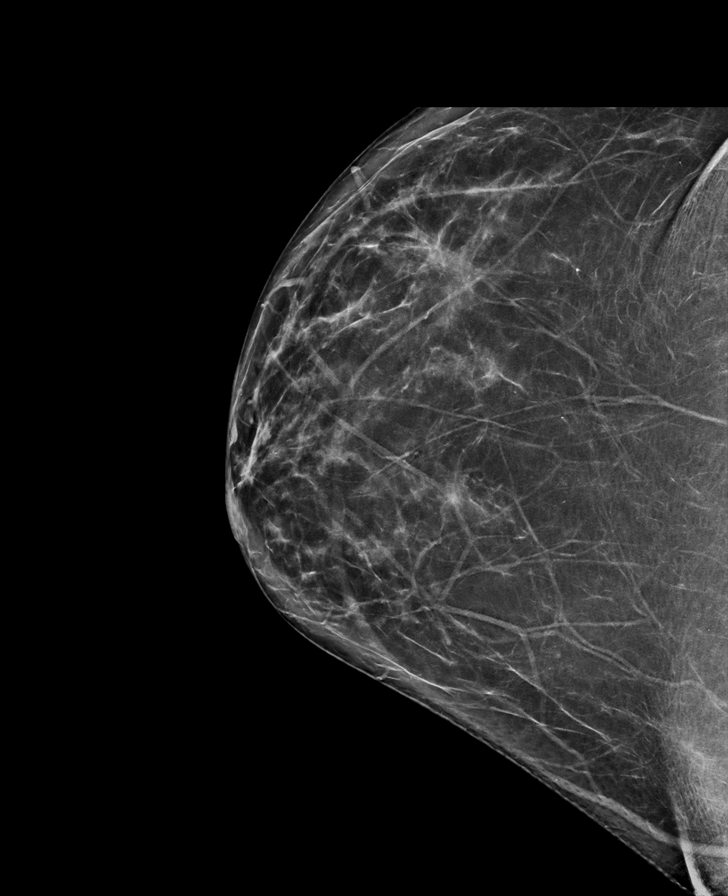

[L MLO synth-2D (2 of 2)]
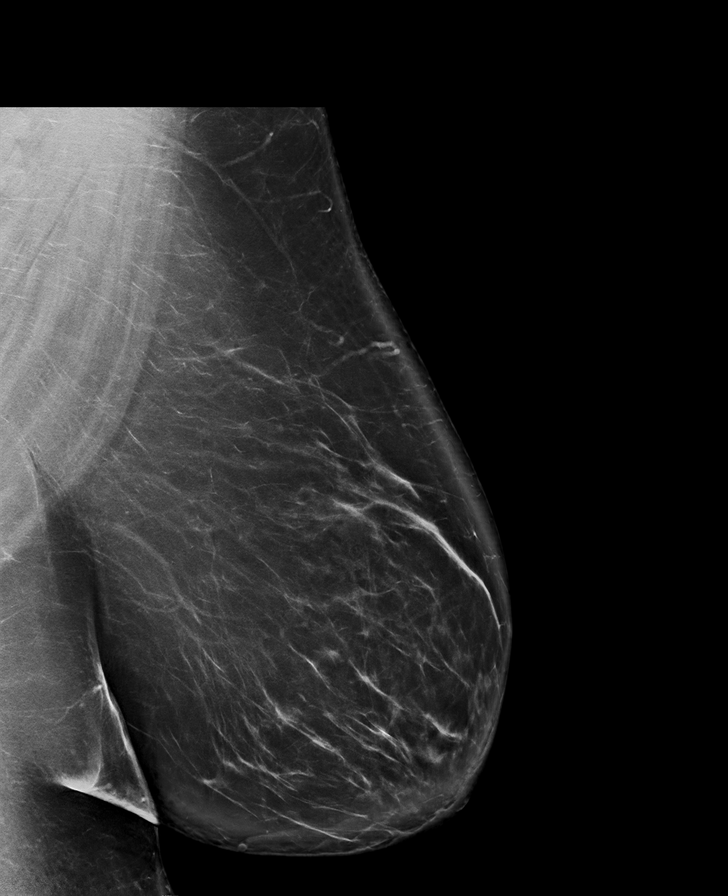

[R MLO tomo · tomo slice 55/109.0]
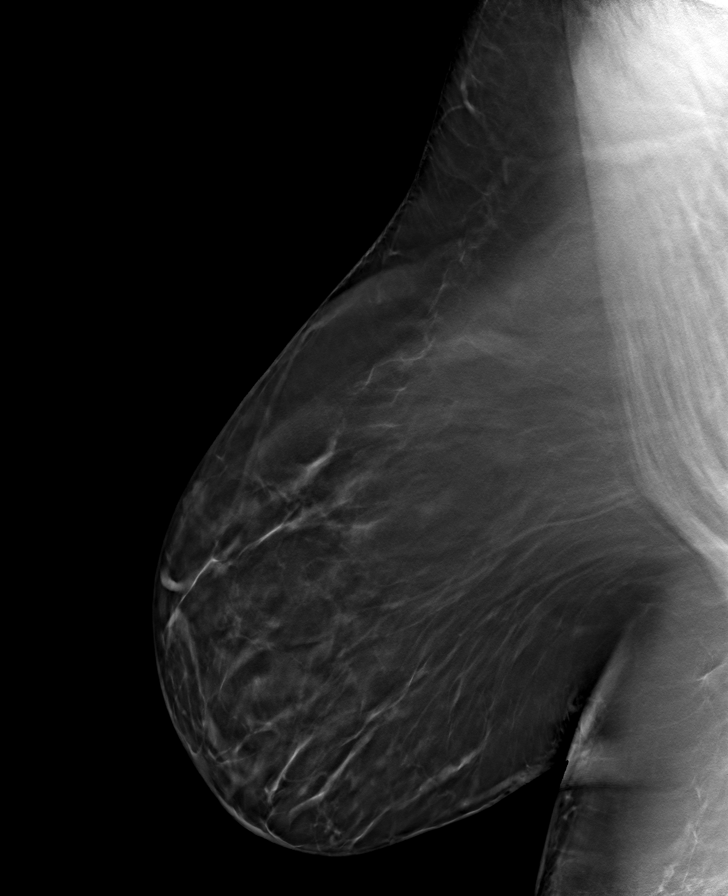

[8 of 40 positions shown; findings below may reference images not displayed]

ACR Breast Density Category b: There are scattered areas of
fibroglandular density.
FINDINGS: There are no findings suspicious for malignancy.
IMPRESSION: No mammographic evidence of malignancy. A result letter of this
screening mammogram will be mailed directly to the patient.

RECOMMENDATION:
Screening mammogram in one year. (Code:[id number]-X-X7B)

BI-RADS CATEGORY  1: Negative.

## 2022-12-18 DIAGNOSIS — F419 Anxiety disorder, unspecified: Secondary | ICD-10-CM | POA: Diagnosis not present

## 2022-12-18 DIAGNOSIS — F1920 Other psychoactive substance dependence, uncomplicated: Secondary | ICD-10-CM | POA: Diagnosis not present

## 2022-12-18 DIAGNOSIS — R110 Nausea: Secondary | ICD-10-CM | POA: Diagnosis not present

## 2022-12-18 DIAGNOSIS — F1120 Opioid dependence, uncomplicated: Secondary | ICD-10-CM | POA: Diagnosis not present

## 2023-01-01 DIAGNOSIS — R110 Nausea: Secondary | ICD-10-CM | POA: Diagnosis not present

## 2023-01-01 DIAGNOSIS — F419 Anxiety disorder, unspecified: Secondary | ICD-10-CM | POA: Diagnosis not present

## 2023-01-01 DIAGNOSIS — F1920 Other psychoactive substance dependence, uncomplicated: Secondary | ICD-10-CM | POA: Diagnosis not present

## 2023-01-01 DIAGNOSIS — F1120 Opioid dependence, uncomplicated: Secondary | ICD-10-CM | POA: Diagnosis not present

## 2023-01-07 DIAGNOSIS — R110 Nausea: Secondary | ICD-10-CM | POA: Diagnosis not present

## 2023-01-07 DIAGNOSIS — F1920 Other psychoactive substance dependence, uncomplicated: Secondary | ICD-10-CM | POA: Diagnosis not present

## 2023-01-07 DIAGNOSIS — F1120 Opioid dependence, uncomplicated: Secondary | ICD-10-CM | POA: Diagnosis not present

## 2023-01-07 DIAGNOSIS — B3731 Acute candidiasis of vulva and vagina: Secondary | ICD-10-CM | POA: Diagnosis not present

## 2023-01-21 DIAGNOSIS — F419 Anxiety disorder, unspecified: Secondary | ICD-10-CM | POA: Diagnosis not present

## 2023-01-21 DIAGNOSIS — F1920 Other psychoactive substance dependence, uncomplicated: Secondary | ICD-10-CM | POA: Diagnosis not present

## 2023-01-21 DIAGNOSIS — R110 Nausea: Secondary | ICD-10-CM | POA: Diagnosis not present

## 2023-01-21 DIAGNOSIS — F1120 Opioid dependence, uncomplicated: Secondary | ICD-10-CM | POA: Diagnosis not present

## 2023-02-12 ENCOUNTER — Other Ambulatory Visit: Payer: Self-pay

## 2023-02-12 DIAGNOSIS — R110 Nausea: Secondary | ICD-10-CM | POA: Diagnosis not present

## 2023-02-12 DIAGNOSIS — F1120 Opioid dependence, uncomplicated: Secondary | ICD-10-CM | POA: Diagnosis not present

## 2023-02-12 DIAGNOSIS — F419 Anxiety disorder, unspecified: Secondary | ICD-10-CM | POA: Diagnosis not present

## 2023-02-12 DIAGNOSIS — Z1231 Encounter for screening mammogram for malignant neoplasm of breast: Secondary | ICD-10-CM

## 2023-02-12 DIAGNOSIS — F1920 Other psychoactive substance dependence, uncomplicated: Secondary | ICD-10-CM | POA: Diagnosis not present

## 2023-02-26 DIAGNOSIS — F1920 Other psychoactive substance dependence, uncomplicated: Secondary | ICD-10-CM | POA: Diagnosis not present

## 2023-02-26 DIAGNOSIS — R110 Nausea: Secondary | ICD-10-CM | POA: Diagnosis not present

## 2023-02-26 DIAGNOSIS — R600 Localized edema: Secondary | ICD-10-CM | POA: Diagnosis not present

## 2023-02-26 DIAGNOSIS — F1120 Opioid dependence, uncomplicated: Secondary | ICD-10-CM | POA: Diagnosis not present

## 2023-03-12 DIAGNOSIS — R110 Nausea: Secondary | ICD-10-CM | POA: Diagnosis not present

## 2023-03-12 DIAGNOSIS — R600 Localized edema: Secondary | ICD-10-CM | POA: Diagnosis not present

## 2023-03-12 DIAGNOSIS — F1120 Opioid dependence, uncomplicated: Secondary | ICD-10-CM | POA: Diagnosis not present

## 2023-03-12 DIAGNOSIS — F1920 Other psychoactive substance dependence, uncomplicated: Secondary | ICD-10-CM | POA: Diagnosis not present

## 2023-03-26 DIAGNOSIS — F1920 Other psychoactive substance dependence, uncomplicated: Secondary | ICD-10-CM | POA: Diagnosis not present

## 2023-03-26 DIAGNOSIS — R110 Nausea: Secondary | ICD-10-CM | POA: Diagnosis not present

## 2023-03-26 DIAGNOSIS — R600 Localized edema: Secondary | ICD-10-CM | POA: Diagnosis not present

## 2023-03-26 DIAGNOSIS — F1120 Opioid dependence, uncomplicated: Secondary | ICD-10-CM | POA: Diagnosis not present

## 2023-04-09 DIAGNOSIS — F1920 Other psychoactive substance dependence, uncomplicated: Secondary | ICD-10-CM | POA: Diagnosis not present

## 2023-04-09 DIAGNOSIS — F1120 Opioid dependence, uncomplicated: Secondary | ICD-10-CM | POA: Diagnosis not present

## 2023-04-09 DIAGNOSIS — R110 Nausea: Secondary | ICD-10-CM | POA: Diagnosis not present

## 2023-04-09 DIAGNOSIS — R600 Localized edema: Secondary | ICD-10-CM | POA: Diagnosis not present

## 2023-04-23 DIAGNOSIS — R110 Nausea: Secondary | ICD-10-CM | POA: Diagnosis not present

## 2023-04-23 DIAGNOSIS — F1120 Opioid dependence, uncomplicated: Secondary | ICD-10-CM | POA: Diagnosis not present

## 2023-04-23 DIAGNOSIS — R600 Localized edema: Secondary | ICD-10-CM | POA: Diagnosis not present

## 2023-04-23 DIAGNOSIS — N390 Urinary tract infection, site not specified: Secondary | ICD-10-CM | POA: Diagnosis not present

## 2023-05-07 DIAGNOSIS — R110 Nausea: Secondary | ICD-10-CM | POA: Diagnosis not present

## 2023-05-07 DIAGNOSIS — F1120 Opioid dependence, uncomplicated: Secondary | ICD-10-CM | POA: Diagnosis not present

## 2023-05-07 DIAGNOSIS — R600 Localized edema: Secondary | ICD-10-CM | POA: Diagnosis not present

## 2023-05-07 DIAGNOSIS — F1920 Other psychoactive substance dependence, uncomplicated: Secondary | ICD-10-CM | POA: Diagnosis not present

## 2023-05-21 DIAGNOSIS — R600 Localized edema: Secondary | ICD-10-CM | POA: Diagnosis not present

## 2023-05-21 DIAGNOSIS — F1920 Other psychoactive substance dependence, uncomplicated: Secondary | ICD-10-CM | POA: Diagnosis not present

## 2023-05-21 DIAGNOSIS — R110 Nausea: Secondary | ICD-10-CM | POA: Diagnosis not present

## 2023-05-21 DIAGNOSIS — F1120 Opioid dependence, uncomplicated: Secondary | ICD-10-CM | POA: Diagnosis not present

## 2023-06-04 DIAGNOSIS — R600 Localized edema: Secondary | ICD-10-CM | POA: Diagnosis not present

## 2023-06-04 DIAGNOSIS — R5382 Chronic fatigue, unspecified: Secondary | ICD-10-CM | POA: Diagnosis not present

## 2023-06-04 DIAGNOSIS — R110 Nausea: Secondary | ICD-10-CM | POA: Diagnosis not present

## 2023-06-04 DIAGNOSIS — F1120 Opioid dependence, uncomplicated: Secondary | ICD-10-CM | POA: Diagnosis not present

## 2023-06-04 DIAGNOSIS — E039 Hypothyroidism, unspecified: Secondary | ICD-10-CM | POA: Diagnosis not present

## 2023-06-18 DIAGNOSIS — F1120 Opioid dependence, uncomplicated: Secondary | ICD-10-CM | POA: Diagnosis not present

## 2023-06-18 DIAGNOSIS — R600 Localized edema: Secondary | ICD-10-CM | POA: Diagnosis not present

## 2023-06-18 DIAGNOSIS — R110 Nausea: Secondary | ICD-10-CM | POA: Diagnosis not present

## 2023-06-18 DIAGNOSIS — F1920 Other psychoactive substance dependence, uncomplicated: Secondary | ICD-10-CM | POA: Diagnosis not present

## 2023-07-01 DIAGNOSIS — F1920 Other psychoactive substance dependence, uncomplicated: Secondary | ICD-10-CM | POA: Diagnosis not present

## 2023-07-01 DIAGNOSIS — R110 Nausea: Secondary | ICD-10-CM | POA: Diagnosis not present

## 2023-07-01 DIAGNOSIS — F1120 Opioid dependence, uncomplicated: Secondary | ICD-10-CM | POA: Diagnosis not present

## 2023-07-01 DIAGNOSIS — R600 Localized edema: Secondary | ICD-10-CM | POA: Diagnosis not present

## 2023-07-15 DIAGNOSIS — R600 Localized edema: Secondary | ICD-10-CM | POA: Diagnosis not present

## 2023-07-15 DIAGNOSIS — F1920 Other psychoactive substance dependence, uncomplicated: Secondary | ICD-10-CM | POA: Diagnosis not present

## 2023-07-15 DIAGNOSIS — F1120 Opioid dependence, uncomplicated: Secondary | ICD-10-CM | POA: Diagnosis not present

## 2023-07-15 DIAGNOSIS — R110 Nausea: Secondary | ICD-10-CM | POA: Diagnosis not present

## 2023-07-29 DIAGNOSIS — F1120 Opioid dependence, uncomplicated: Secondary | ICD-10-CM | POA: Diagnosis not present

## 2023-07-29 DIAGNOSIS — R600 Localized edema: Secondary | ICD-10-CM | POA: Diagnosis not present

## 2023-07-29 DIAGNOSIS — F1920 Other psychoactive substance dependence, uncomplicated: Secondary | ICD-10-CM | POA: Diagnosis not present

## 2023-07-29 DIAGNOSIS — R110 Nausea: Secondary | ICD-10-CM | POA: Diagnosis not present

## 2023-08-12 DIAGNOSIS — R600 Localized edema: Secondary | ICD-10-CM | POA: Diagnosis not present

## 2023-08-12 DIAGNOSIS — F1120 Opioid dependence, uncomplicated: Secondary | ICD-10-CM | POA: Diagnosis not present

## 2023-08-12 DIAGNOSIS — F1920 Other psychoactive substance dependence, uncomplicated: Secondary | ICD-10-CM | POA: Diagnosis not present

## 2023-08-12 DIAGNOSIS — R110 Nausea: Secondary | ICD-10-CM | POA: Diagnosis not present

## 2023-08-21 DIAGNOSIS — R161 Splenomegaly, not elsewhere classified: Secondary | ICD-10-CM | POA: Diagnosis not present

## 2023-08-21 DIAGNOSIS — R0789 Other chest pain: Secondary | ICD-10-CM | POA: Diagnosis not present

## 2023-08-21 DIAGNOSIS — E039 Hypothyroidism, unspecified: Secondary | ICD-10-CM | POA: Diagnosis not present

## 2023-08-21 DIAGNOSIS — D693 Immune thrombocytopenic purpura: Secondary | ICD-10-CM | POA: Diagnosis not present

## 2023-08-21 DIAGNOSIS — Z880 Allergy status to penicillin: Secondary | ICD-10-CM | POA: Diagnosis not present

## 2023-08-21 DIAGNOSIS — F17290 Nicotine dependence, other tobacco product, uncomplicated: Secondary | ICD-10-CM | POA: Diagnosis not present

## 2023-08-21 DIAGNOSIS — R0602 Shortness of breath: Secondary | ICD-10-CM | POA: Diagnosis not present

## 2023-08-21 DIAGNOSIS — R079 Chest pain, unspecified: Secondary | ICD-10-CM | POA: Diagnosis not present

## 2023-08-21 DIAGNOSIS — Z79890 Hormone replacement therapy (postmenopausal): Secondary | ICD-10-CM | POA: Diagnosis not present

## 2023-08-21 DIAGNOSIS — R0989 Other specified symptoms and signs involving the circulatory and respiratory systems: Secondary | ICD-10-CM | POA: Diagnosis not present

## 2023-08-22 DIAGNOSIS — Z79899 Other long term (current) drug therapy: Secondary | ICD-10-CM | POA: Diagnosis not present

## 2023-08-22 DIAGNOSIS — F411 Generalized anxiety disorder: Secondary | ICD-10-CM | POA: Diagnosis not present

## 2023-08-22 DIAGNOSIS — F1120 Opioid dependence, uncomplicated: Secondary | ICD-10-CM | POA: Diagnosis not present

## 2023-08-22 DIAGNOSIS — Z6841 Body Mass Index (BMI) 40.0 and over, adult: Secondary | ICD-10-CM | POA: Diagnosis not present

## 2023-08-22 DIAGNOSIS — F330 Major depressive disorder, recurrent, mild: Secondary | ICD-10-CM | POA: Diagnosis not present

## 2023-09-08 DIAGNOSIS — R079 Chest pain, unspecified: Secondary | ICD-10-CM | POA: Diagnosis not present

## 2023-09-12 DIAGNOSIS — F330 Major depressive disorder, recurrent, mild: Secondary | ICD-10-CM | POA: Diagnosis not present

## 2023-09-12 DIAGNOSIS — Z79899 Other long term (current) drug therapy: Secondary | ICD-10-CM | POA: Diagnosis not present

## 2023-09-12 DIAGNOSIS — F411 Generalized anxiety disorder: Secondary | ICD-10-CM | POA: Diagnosis not present

## 2023-09-12 DIAGNOSIS — F1120 Opioid dependence, uncomplicated: Secondary | ICD-10-CM | POA: Diagnosis not present

## 2023-09-12 DIAGNOSIS — Z6841 Body Mass Index (BMI) 40.0 and over, adult: Secondary | ICD-10-CM | POA: Diagnosis not present

## 2023-10-03 DIAGNOSIS — Z6841 Body Mass Index (BMI) 40.0 and over, adult: Secondary | ICD-10-CM | POA: Diagnosis not present

## 2023-10-03 DIAGNOSIS — F1120 Opioid dependence, uncomplicated: Secondary | ICD-10-CM | POA: Diagnosis not present

## 2023-10-03 DIAGNOSIS — F330 Major depressive disorder, recurrent, mild: Secondary | ICD-10-CM | POA: Diagnosis not present

## 2023-10-03 DIAGNOSIS — F411 Generalized anxiety disorder: Secondary | ICD-10-CM | POA: Diagnosis not present

## 2023-10-16 DIAGNOSIS — R0981 Nasal congestion: Secondary | ICD-10-CM | POA: Diagnosis not present

## 2023-10-16 DIAGNOSIS — R051 Acute cough: Secondary | ICD-10-CM | POA: Diagnosis not present

## 2023-10-16 DIAGNOSIS — J189 Pneumonia, unspecified organism: Secondary | ICD-10-CM | POA: Diagnosis not present

## 2023-10-16 DIAGNOSIS — R0602 Shortness of breath: Secondary | ICD-10-CM | POA: Diagnosis not present

## 2023-10-24 DIAGNOSIS — Z6841 Body Mass Index (BMI) 40.0 and over, adult: Secondary | ICD-10-CM | POA: Diagnosis not present

## 2023-10-24 DIAGNOSIS — F1120 Opioid dependence, uncomplicated: Secondary | ICD-10-CM | POA: Diagnosis not present

## 2023-10-24 DIAGNOSIS — Z79899 Other long term (current) drug therapy: Secondary | ICD-10-CM | POA: Diagnosis not present

## 2023-10-24 DIAGNOSIS — F411 Generalized anxiety disorder: Secondary | ICD-10-CM | POA: Diagnosis not present

## 2023-10-24 DIAGNOSIS — F330 Major depressive disorder, recurrent, mild: Secondary | ICD-10-CM | POA: Diagnosis not present

## 2023-10-26 DIAGNOSIS — Z9081 Acquired absence of spleen: Secondary | ICD-10-CM | POA: Diagnosis not present

## 2023-10-26 DIAGNOSIS — Z79890 Hormone replacement therapy (postmenopausal): Secondary | ICD-10-CM | POA: Diagnosis not present

## 2023-10-26 DIAGNOSIS — J189 Pneumonia, unspecified organism: Secondary | ICD-10-CM | POA: Diagnosis not present

## 2023-10-26 DIAGNOSIS — Z862 Personal history of diseases of the blood and blood-forming organs and certain disorders involving the immune mechanism: Secondary | ICD-10-CM | POA: Diagnosis not present

## 2023-10-26 DIAGNOSIS — Z85850 Personal history of malignant neoplasm of thyroid: Secondary | ICD-10-CM | POA: Diagnosis not present

## 2023-10-26 DIAGNOSIS — F17290 Nicotine dependence, other tobacco product, uncomplicated: Secondary | ICD-10-CM | POA: Diagnosis not present

## 2023-10-26 DIAGNOSIS — R7989 Other specified abnormal findings of blood chemistry: Secondary | ICD-10-CM | POA: Diagnosis not present

## 2023-10-26 DIAGNOSIS — R0600 Dyspnea, unspecified: Secondary | ICD-10-CM | POA: Diagnosis not present

## 2023-10-26 DIAGNOSIS — Z880 Allergy status to penicillin: Secondary | ICD-10-CM | POA: Diagnosis not present

## 2023-10-26 DIAGNOSIS — E890 Postprocedural hypothyroidism: Secondary | ICD-10-CM | POA: Diagnosis not present

## 2023-10-26 DIAGNOSIS — R918 Other nonspecific abnormal finding of lung field: Secondary | ICD-10-CM | POA: Diagnosis not present

## 2023-10-26 DIAGNOSIS — R0789 Other chest pain: Secondary | ICD-10-CM | POA: Diagnosis not present

## 2023-10-26 DIAGNOSIS — R0781 Pleurodynia: Secondary | ICD-10-CM | POA: Diagnosis not present

## 2023-10-26 DIAGNOSIS — Z87891 Personal history of nicotine dependence: Secondary | ICD-10-CM | POA: Diagnosis not present

## 2023-10-27 DIAGNOSIS — R918 Other nonspecific abnormal finding of lung field: Secondary | ICD-10-CM | POA: Diagnosis not present

## 2023-10-27 DIAGNOSIS — R0781 Pleurodynia: Secondary | ICD-10-CM | POA: Diagnosis not present

## 2023-10-27 DIAGNOSIS — R7989 Other specified abnormal findings of blood chemistry: Secondary | ICD-10-CM | POA: Diagnosis not present

## 2023-11-02 ENCOUNTER — Emergency Department (HOSPITAL_COMMUNITY)
Admission: EM | Admit: 2023-11-02 | Discharge: 2023-11-02 | Disposition: A | Discharge status: Home / Self Care | Attending: Emergency Medicine | Admitting: Emergency Medicine

## 2023-11-02 ENCOUNTER — Emergency Department (HOSPITAL_COMMUNITY)

## 2023-11-02 ENCOUNTER — Other Ambulatory Visit: Payer: Self-pay

## 2023-11-02 ENCOUNTER — Encounter (HOSPITAL_COMMUNITY): Payer: Self-pay

## 2023-11-02 DIAGNOSIS — E039 Hypothyroidism, unspecified: Secondary | ICD-10-CM | POA: Insufficient documentation

## 2023-11-02 DIAGNOSIS — Z79899 Other long term (current) drug therapy: Secondary | ICD-10-CM | POA: Insufficient documentation

## 2023-11-02 DIAGNOSIS — R079 Chest pain, unspecified: Secondary | ICD-10-CM | POA: Diagnosis not present

## 2023-11-02 DIAGNOSIS — J40 Bronchitis, not specified as acute or chronic: Secondary | ICD-10-CM | POA: Diagnosis not present

## 2023-11-02 DIAGNOSIS — R0789 Other chest pain: Secondary | ICD-10-CM | POA: Diagnosis not present

## 2023-11-02 LAB — BASIC METABOLIC PANEL
Anion gap: 8 (ref 5–15)
BUN: 10 mg/dL (ref 6–20)
CO2: 28 mmol/L (ref 22–32)
Calcium: 8.8 mg/dL — ABNORMAL LOW (ref 8.9–10.3)
Chloride: 103 mmol/L (ref 98–111)
Creatinine, Ser: 0.78 mg/dL (ref 0.44–1.00)
GFR, Estimated: >60 mL/min (ref >60)
Glucose, Bld: 103 mg/dL — ABNORMAL HIGH (ref 70–99)
Potassium: 4.3 mmol/L (ref 3.5–5.1)
Sodium: 139 mmol/L (ref 135–145)

## 2023-11-02 LAB — CBC
HCT: 40.8 % (ref 36.0–46.0)
Hemoglobin: 12.6 g/dL (ref 12.0–15.0)
MCH: 27.6 pg (ref 26.0–34.0)
MCHC: 30.9 g/dL (ref 30.0–36.0)
MCV: 89.3 fL (ref 80.0–100.0)
Platelets: 445 10*3/uL — ABNORMAL HIGH (ref 150–400)
RBC: 4.57 MIL/uL (ref 3.87–5.11)
RDW: 15.2 % (ref 11.5–15.5)
WBC: 15.7 10*3/uL — ABNORMAL HIGH (ref 4.0–10.5)
nRBC: 0 % (ref 0.0–0.2)

## 2023-11-02 LAB — HCG, SERUM, QUALITATIVE: Preg, Serum: NEGATIVE

## 2023-11-02 LAB — TROPONIN I (HIGH SENSITIVITY)
Troponin I (High Sensitivity): <2 ng/L (ref <18)
Troponin I (High Sensitivity): <2 ng/L (ref <18)

## 2023-11-02 MED ORDER — PREDNISONE 10 MG PO TABS
ORAL_TABLET | ORAL | 0 refills | Status: AC
Start: 2023-11-02 — End: 2023-11-09

## 2023-11-02 NOTE — ED Triage Notes (Signed)
 Pt reports intermittent L sided chest pain and SHOB x1 day.

## 2023-11-02 NOTE — Discharge Instructions (Addendum)
 Your workup today is reassuring. It is very unlikely that your pain is due to an issue in your heart.  Your cardiac enzyme (troponin) was normal today. Your EKG which is a measure of the heart's electrical activity and rhythm is normal today. These would both show abnormalities if you were having a heart attack.  Your chest x-ray shows signs of bronchitis.  This is likely where you are pain is coming from. I have included the x-ray result below.  You been prescribed a steroid taper to help with the lung inflammation and pain.  Please take this first thing in the morning with breakfast to prevent it from keeping you up at night.   You had a slightly low calcium here today.  Please increase your dietary intake of calcium at home with foods such as dairy products.  Return to the ER if you have any shortness of breath, difficulty breathing, worsening chest pain, dizziness, jaw pain, left arm or shoulder pain, abdominal pain, unexplained fever, any other new or concerning symptoms.   " EXAM:  CHEST - 2 VIEW    COMPARISON:  Report from radiographs 10/28/2018. No images are  available.    FINDINGS:  Mild perihilar and peribronchial thickening. No focal consolidation,  pneumothorax or pleural effusion. Normal cardiomediastinal  silhouette. No acute bone abnormality.    IMPRESSION:  Mild bronchitis/reactive airways.      Electronically Signed    By: Minerva Fester M.D.    On: 11/02/2023 02:02  "

## 2023-11-02 NOTE — ED Provider Notes (Signed)
 [place] EMERGENCY DEPARTMENT AT [facility] Provider Note   CSN: [id number] Arrival date & time: 11/02/23  0114     History  Chief Complaint  Patient presents with   Chest Pain    Latoya Ayers is a 48 y.o. female with history of ITP, obesity, hypothyroidism, who presents for intermittent left sided chest pain that has been ongoing for the past day.  States the pain stays in the center of her chest, does not radiate anywhere else.  Pain is nonexertional, nonpleuritic.  Reports some associated shortness of breath.  Denies any dizziness.  Reports mild swelling in her legs that has been ongoing for a "long time" now.  Denies any calf pain.  Denies any long plane or car rides recently, recent hospitalizations or surgeries, hormonal medication use, or history of DVT or PE. She states she had her thyroid level checked recently and appropriate adjustments were made.    Chest Pain      Home Medications Prior to Admission medications   Medication Sig Start Date End Date Taking? Authorizing Provider  predniSONE (DELTASONE) 10 MG tablet Take 4 tablets (40 mg total) by mouth daily with breakfast for 2 days, THEN 3 tablets (30 mg total) daily with breakfast for 2 days, THEN 2 tablets (20 mg total) daily with breakfast for 2 days, THEN 1 tablet (10 mg total) daily with breakfast for 1 day. 11/02/23 11/09/23 Yes Arabella Merles, PA-C  albuterol (VENTOLIN HFA) 108 (90 Base) MCG/ACT inhaler Inhale 2 puffs into the lungs every 4 (four) hours as needed. 10/09/20   [provider]  Buprenorphine HCl-Naloxone HCl 8-2 MG FILM Place under the tongue daily. 10/13/20   [provider]  cyanocobalamin 1000 MCG tablet Take by mouth.    [provider]  hydrochlorothiazide (MICROZIDE) 12.5 MG capsule Take by mouth.    [provider]  levothyroxine (SYNTHROID) 150 MCG tablet Take 150 mcg by mouth. 10/09/20   [provider]  Multiple Vitamin (MULTI-VITAMIN)  tablet Take 1 tablet by mouth daily.    [provider]  nicotine (NICODERM CQ - DOSED IN MG/24 HOURS) 21 mg/24hr patch Place onto the skin. 10/29/18   [provider]  phentermine 37.5 MG capsule Take by mouth.    [provider]  sulfamethoxazole-trimethoprim (BACTRIM DS) 800-160 MG tablet Take 1 tablet by mouth daily. 10/23/17   [provider]  tranexamic acid (LYSTEDA) 650 MG TABS tablet  10/09/20   [provider]      Allergies    Penicillins    Review of Systems   Review of Systems  Cardiovascular:  Positive for chest pain.    Physical Exam Updated Vital Signs BP 136/87 (BP Location: Left Arm)   Pulse 99   Temp 98 F (36.7 C) (Oral)   Resp 16   Ht 5\' 4"  (1.626 m)   Wt (!) 149.7 kg   LMP 10/09/2023   SpO2 96%   BMI 56.64 kg/m  Physical Exam Vitals and nursing note reviewed.  Constitutional:      General: She is not in acute distress.    Appearance: She is well-developed.  HENT:     Head: Normocephalic and atraumatic.  Eyes:     Conjunctiva/sclera: Conjunctivae normal.  Cardiovascular:     Rate and Rhythm: Normal rate and regular rhythm.     Heart sounds: No murmur heard.    Comments: Radial pulse 2+ bilaterally Dorsalis pedis pulse 2+ bilaterally Pulmonary:  Effort: Pulmonary effort is normal. No respiratory distress.     Breath sounds: Normal breath sounds.  Abdominal:     Palpations: Abdomen is soft.     Tenderness: There is no abdominal tenderness.  Musculoskeletal:        General: No swelling.     Cervical back: Neck supple.     Comments: No calf tenderness to palpation bilaterally  Skin:    General: Skin is warm and dry.     Capillary Refill: Capillary refill takes less than 2 seconds.     Comments: Mild 1+ edema of the feet bilaterally   Neurological:     Mental Status: She is alert.  Psychiatric:        Mood and Affect: Mood normal.     ED Results / Procedures / Treatments   Labs (all labs  ordered are listed, but only abnormal results are displayed) Labs Reviewed  BASIC METABOLIC PANEL - Abnormal; Notable for the following components:      Result Value   Glucose, Bld 103 (*)    Calcium 8.8 (*)    All other components within normal limits  CBC - Abnormal; Notable for the following components:   WBC 15.7 (*)    Platelets 445 (*)    All other components within normal limits  HCG, SERUM, QUALITATIVE  TROPONIN I (HIGH SENSITIVITY)  TROPONIN I (HIGH SENSITIVITY)    EKG EKG Interpretation Date/Time:  Sunday November 02 2023 01:23:35 EST Ventricular Rate:  97 PR Interval:  116 QRS Duration:  84 QT Interval:  344 QTC Calculation: 437 R Axis:   5  Text Interpretation: Sinus rhythm Borderline short PR interval Abnormal R-wave progression, early transition No previous ECGs available Confirmed by Zadie Rhine ([id number]) on 11/02/2023 2:43:07 AM  Radiology DG Chest 2 View Result Date: 11/02/2023 CLINICAL DATA:  Intermittent left-sided chest pain and shortness of breath EXAM: CHEST - 2 VIEW COMPARISON:  Report from radiographs 10/28/2018. No images are available. FINDINGS: Mild perihilar and peribronchial thickening. No focal consolidation, pneumothorax or pleural effusion. Normal cardiomediastinal silhouette. No acute bone abnormality. IMPRESSION: Mild bronchitis/reactive airways. Electronically Signed   By: Minerva Fester M.D.   On: 11/02/2023 02:02    Procedures Procedures    Medications Ordered in ED Medications - No data to display  ED Course/ Medical Decision Making/ A&P                                 Medical Decision Making Amount and/or Complexity of Data Reviewed Labs: ordered. Radiology: ordered.     Differential diagnosis includes but is not limited to ACS, arrhythmia, aortic aneurysm, pericarditis, myocarditis, pericardial effusion, cardiac tamponade, musculoskeletal pain, GERD, Boerhaave's syndrome, DVT/PE, pneumonia, pleural effusion   ED  Course:  Patient is very well-appearing, stable vital signs.  Low concern for ACS at this time given troponin remains stable with initial troponin of less than 2 and repeat of less than 2, pain non-exertional, EKG with normal sinus rhythm, no ST changes.  No concern for DVT or PE at this time given PERC negative and CTA chest at outside ER visit from 10/26/2023 was negative for PE. Patient without any positional pain, no fevers, chills, or other sick symptoms, low concern for pericarditis or myocarditis at this time.  I will concern for heart failure given no signs of fluid overload on chest x-ray, and no acute worsening of her lower extremity edema.  No  indication for adding on BNP at this time.  She also recently had her TSH checked, no occasion for rechecking this today. I Ordered, and personally interpreted labs.  The pertinent results include:   CBC with leukocytosis of 15, this appears to be at her baseline BMP with hypocalcemia 8.8, otherwise unremarkable Initial troponin of less than 2, repeat less than 2 Pregnancy negative Chest x-ray with mild perihilar and peribronchial thickening consistent with mild bronchitis/reactive airways.  Feel that she may have some lung irritation that could be causing her chest pain today.  Low concern for any other emergent cause to her pain today.  She does not have any history of diabetes, I discussed that we could try a course of prednisone to help with her lung inflammation and pain.  She would like to try this.  Will prescribe 7-day course of prednisone to use at home.  Patient stable appropriate for discharge home at this time  Impression: Bronchitis  Disposition:  The patient was discharged home with instructions to take 7-day taper prednisone as prescribed.  I included information for local PCP offices, recommended she set up an appointment with them as soon as possible for routine care and further follow-up. Return precautions given.  Imaging Studies  ordered: I ordered imaging studies including chest x-ray I independently visualized the imaging with scope of interpretation limited to determining acute life threatening conditions related to emergency care. Imaging showed mild perihilar and peribronchial thickening consistent with mild bronchitis/reactive airways. I agree with the radiologist interpretation   Cardiac Monitoring: / EKG: The patient was maintained on a cardiac monitor.  I personally viewed and interpreted the cardiac monitored which showed an underlying rhythm of: Normal sinus rhythm, no ST changes   External records from outside source obtained and reviewed including [facility] emergency rooms visits from 08/21/2023 and 10/26/2023 where she was seen for chest pain and diagnosed with pneumonia.  Visit in December revealed abnormal TSH.  At visit on 10/28/2023 CTA chest was performed which was negative for PE              Final Clinical Impression(s) / ED Diagnoses Final diagnoses:  Bronchitis    Rx / DC Orders ED Discharge Orders          Ordered    predniSONE (DELTASONE) 10 MG tablet  Q breakfast        11/02/23 0634              Arabella Merles, PA-C 11/02/23 0720    Zadie Rhine, MD 11/02/23 [phone]

## 2023-11-03 ENCOUNTER — Encounter: Payer: Self-pay | Admitting: Hematology

## 2023-11-14 DIAGNOSIS — F330 Major depressive disorder, recurrent, mild: Secondary | ICD-10-CM | POA: Diagnosis not present

## 2023-11-14 DIAGNOSIS — F411 Generalized anxiety disorder: Secondary | ICD-10-CM | POA: Diagnosis not present

## 2023-11-14 DIAGNOSIS — F1120 Opioid dependence, uncomplicated: Secondary | ICD-10-CM | POA: Diagnosis not present

## 2023-11-17 ENCOUNTER — Emergency Department (HOSPITAL_COMMUNITY)
Admission: EM | Admit: 2023-11-17 | Discharge: 2023-11-17 | Disposition: A | Discharge status: Home / Self Care | Attending: Emergency Medicine | Admitting: Emergency Medicine

## 2023-11-17 ENCOUNTER — Other Ambulatory Visit: Payer: Self-pay

## 2023-11-17 ENCOUNTER — Emergency Department (HOSPITAL_COMMUNITY)

## 2023-11-17 ENCOUNTER — Encounter (HOSPITAL_COMMUNITY): Payer: Self-pay

## 2023-11-17 DIAGNOSIS — D72829 Elevated white blood cell count, unspecified: Secondary | ICD-10-CM | POA: Diagnosis not present

## 2023-11-17 DIAGNOSIS — R072 Precordial pain: Secondary | ICD-10-CM | POA: Insufficient documentation

## 2023-11-17 DIAGNOSIS — R42 Dizziness and giddiness: Secondary | ICD-10-CM | POA: Diagnosis not present

## 2023-11-17 DIAGNOSIS — R079 Chest pain, unspecified: Secondary | ICD-10-CM | POA: Diagnosis not present

## 2023-11-17 DIAGNOSIS — Z79899 Other long term (current) drug therapy: Secondary | ICD-10-CM | POA: Diagnosis not present

## 2023-11-17 DIAGNOSIS — E039 Hypothyroidism, unspecified: Secondary | ICD-10-CM | POA: Insufficient documentation

## 2023-11-17 DIAGNOSIS — Z85850 Personal history of malignant neoplasm of thyroid: Secondary | ICD-10-CM | POA: Insufficient documentation

## 2023-11-17 DIAGNOSIS — R918 Other nonspecific abnormal finding of lung field: Secondary | ICD-10-CM | POA: Diagnosis not present

## 2023-11-17 LAB — CBC
HCT: 40.4 % (ref 36.0–46.0)
Hemoglobin: 12.8 g/dL (ref 12.0–15.0)
MCH: 27.6 pg (ref 26.0–34.0)
MCHC: 31.7 g/dL (ref 30.0–36.0)
MCV: 87.1 fL (ref 80.0–100.0)
Platelets: 469 10*3/uL — ABNORMAL HIGH (ref 150–400)
RBC: 4.64 MIL/uL (ref 3.87–5.11)
RDW: 15.7 % — ABNORMAL HIGH (ref 11.5–15.5)
WBC: 16.6 10*3/uL — ABNORMAL HIGH (ref 4.0–10.5)
nRBC: 0 % (ref 0.0–0.2)

## 2023-11-17 LAB — BASIC METABOLIC PANEL
Anion gap: 7 (ref 5–15)
BUN: 9 mg/dL (ref 6–20)
CO2: 23 mmol/L (ref 22–32)
Calcium: 8 mg/dL — ABNORMAL LOW (ref 8.9–10.3)
Chloride: 108 mmol/L (ref 98–111)
Creatinine, Ser: 0.65 mg/dL (ref 0.44–1.00)
GFR, Estimated: >60 mL/min (ref >60)
Glucose, Bld: 107 mg/dL — ABNORMAL HIGH (ref 70–99)
Potassium: 4.1 mmol/L (ref 3.5–5.1)
Sodium: 138 mmol/L (ref 135–145)

## 2023-11-17 LAB — HCG, SERUM, QUALITATIVE: Preg, Serum: NEGATIVE

## 2023-11-17 LAB — TROPONIN I (HIGH SENSITIVITY)
Troponin I (High Sensitivity): <2 ng/L (ref <18)
Troponin I (High Sensitivity): <2 ng/L (ref <18)

## 2023-11-17 LAB — BRAIN NATRIURETIC PEPTIDE: B Natriuretic Peptide: 86.1 pg/mL (ref 0.0–100.0)

## 2023-11-17 MED ORDER — MORPHINE SULFATE (PF) 4 MG/ML IV SOLN
4.0000 mg | Freq: Once | INTRAVENOUS | Status: DC
  Filled 2023-11-17: qty 1

## 2023-11-17 NOTE — Discharge Instructions (Addendum)
 You were seen in the emergency department today for chest pain.  As we discussed your lab work, EKG, chest x-ray all looked reassuring today.   I recommend monitoring your stress levels.  I have also given you a referral to cardiology for follow-up. Continue to monitor how you are doing overall, and return to the emergency department for any new or worsening symptoms such as: Worsening pain or pain with exertion, difficulty breathing, sweating, or pain or swelling in your legs.

## 2023-11-17 NOTE — ED Provider Notes (Signed)
 [place] EMERGENCY DEPARTMENT AT [facility] Provider Note   CSN: [id number] Arrival date & time: 11/17/23  4332     History  Chief Complaint  Patient presents with   Chest Pain    Latoya Ayers is a 48 y.o. female history of ITP, opioid abuse, hypothyroidism, VTE, thyroid cancer presented for chest pain that has been present since last month.  Patient been seen multiple times in the past including a CTA that was ultimately negative.  Patient had imaging as shown bronchitis as she did have pneumonia last month.  Patient states that today the chest pain returned as she has been having every day since she was last seen.  Patient is not taking any medications for.  Patient states is worse when lying down.  Patient denies any fevers with this and states that is all around her chest and radiates to her right arm.  Patient states that she does not want to leave until she feels 100% better.  Home Medications Prior to Admission medications   Medication Sig Start Date End Date Taking? Authorizing Provider  albuterol (VENTOLIN HFA) 108 (90 Base) MCG/ACT inhaler Inhale 2 puffs into the lungs every 4 (four) hours as needed. 10/09/20   [provider]  Buprenorphine HCl-Naloxone HCl 8-2 MG FILM Place under the tongue daily. 10/13/20   [provider]  cyanocobalamin 1000 MCG tablet Take by mouth.    [provider]  hydrochlorothiazide (MICROZIDE) 12.5 MG capsule Take by mouth.    [provider]  levothyroxine (SYNTHROID) 150 MCG tablet Take 150 mcg by mouth. 10/09/20   [provider]  Multiple Vitamin (MULTI-VITAMIN) tablet Take 1 tablet by mouth daily.    [provider]  nicotine (NICODERM CQ - DOSED IN MG/24 HOURS) 21 mg/24hr patch Place onto the skin. 10/29/18   [provider]  phentermine 37.5 MG capsule Take by mouth.    [provider]  sulfamethoxazole-trimethoprim (BACTRIM DS) 800-160 MG tablet Take 1  tablet by mouth daily. 10/23/17   [provider]  tranexamic acid (LYSTEDA) 650 MG TABS tablet  10/09/20   [provider]      Allergies    Penicillins    Review of Systems   Review of Systems  Cardiovascular:  Positive for chest pain.    Physical Exam Updated Vital Signs BP (!) 167/107 (BP Location: Left Arm)   Pulse 79   Temp 98.9 F (37.2 C) (Oral)   Resp 20   Ht 5\' 4"  (1.626 m)   Wt (!) 149.7 kg   LMP 11/17/2023   SpO2 100%   BMI 56.64 kg/m  Physical Exam Constitutional:      General: She is not in acute distress. Cardiovascular:     Rate and Rhythm: Normal rate and regular rhythm.     Pulses: Normal pulses.     Heart sounds: Normal heart sounds.  Pulmonary:     Effort: Pulmonary effort is normal. No respiratory distress.     Breath sounds: Normal breath sounds.     Comments: Able to speak in full sentences Musculoskeletal:     Right lower leg: No tenderness. No edema.     Left lower leg: No tenderness. No edema.  Skin:    General: Skin is warm and dry.  Neurological:     Mental Status: She is alert.  Psychiatric:        Mood and Affect: Mood normal.    ED Results / Procedures / Treatments  Labs (all labs ordered are listed, but only abnormal results are displayed) Labs Reviewed  BASIC METABOLIC PANEL  CBC  HCG, SERUM, QUALITATIVE  TROPONIN I (HIGH SENSITIVITY)  TROPONIN I (HIGH SENSITIVITY)    EKG EKG Interpretation Date/Time:  Monday November 17 2023 02:38:51 EDT Ventricular Rate:  86 PR Interval:  148 QRS Duration:  89 QT Interval:  365 QTC Calculation: 437 R Axis:   23  Text Interpretation: Sinus rhythm Abnormal R-wave progression, early transition Confirmed by Tilden Fossa [phone]) on 11/17/2023 3:31:36 AM  Radiology DG Chest 2 View Result Date: 11/17/2023 CLINICAL DATA:  Chest pain and dizziness EXAM: CHEST - 2 VIEW COMPARISON:  11/02/2023 FINDINGS: Normal cardiomediastinal silhouette. Question mild bronchial wall  thickening. No focal consolidation, pleural effusion, or pneumothorax. No displaced rib fractures. IMPRESSION: Question mild bronchitis/reactive airways. Electronically Signed   By: Minerva Fester M.D.   On: 11/17/2023 02:58    Procedures Procedures    Medications Ordered in ED Medications - No data to display  ED Course/ Medical Decision Making/ A&P                                 Medical Decision Making Amount and/or Complexity of Data Reviewed Labs: ordered. Radiology: ordered.   Latoya Ayers 48 y.o. presented today for chest pain. Working DDx that I considered at this time includes, but not limited to, ACS, pulmonary embolism, community-acquired pneumonia, aortic dissection, pneumothorax, underlying bony abnormality, anemia, thyrotoxicosis, HTN urgency/emergency, esophageal rupture, CHF exacerbation, valvular disorder, myocarditis, pericarditis, endocarditis, pericardial effusion/cardiac tamponade, pulmonary edema, gastritis/PUD/GERD, esophagitis, MSK.  R/o Dx: pending  Review of prior external notes: 10/26/2023 ED  Unique Tests and My Interpretation:  EKG: Rate, rhythm, axis, intervals all examined and without medically relevant abnormality. ST segments without concerns for elevations Troponin: pending CXR: Bronchitic changes CBC: Chronic leukocytosis 16.6 Serum hCG qualitative: BMP: pending [id number]  Social Determinants of Health: none  Discussion with Independent Historian:  Husband  Discussion of Management of Tests: None  Risk: Low: based on diagnostic testing/clinical impression and treatment plan  Risk Stratification Score: None  Plan: On exam patient was in no acute distress with stable vitals. Patient's physical was ultimately unremarkable. Labs and CXR will be ordered.  The cardiac monitor was ordered secondary to the patient's history of chest pain and to monitor the patient for dysrhythmia. The cardiac monitor revealed normal sinus as  interpreted by me.  Patient states that her legs are normally swollen and that there have been no new changes.  I do not appreciate any pitting edema on exam.  Patient did endorse however some shortness of breath and it being worse when lying down so we will add on BNP as she has not had this in the past.  Upon my review of external records patient has had CTA done last month for this chest pain that was ultimately negative.  Chest x-ray here does show bronchitic changes and I offered medications for this however patient declined as she states that bronchitis is not causing her chest pain.  Patient stable at this time.  Patient signed out to Sm[place], PA-C.  Please review their note for the continuation of patient's care.  The plan at this point is follow-up on labs and discharge if negative.  This chart was dictated using voice recognition software.  Despite best efforts to proofread,  errors can occur which can change the documentation meaning.  Final Clinical Impression(s) / ED Diagnoses Final diagnoses:  None    Rx / DC Orders ED Discharge Orders     None         Remi Deter 11/17/23 1610    Tilden Fossa, MD 11/17/23 [phone]

## 2023-11-17 NOTE — ED Provider Notes (Signed)
 Care assumed from [facility], PA-C at shift change. Please see their note for further information.  Briefly: Patient presents with chest pain ongoing x 1 month. Seen several times previously, has had labs and CTA which have been normal. She was originally diagnosed with pneumonia, given azithromycin which she completed. Then diagnosed with bronchitis, given steroids which she completed. She states her pain has persisted. She has not seen cardiology. She does smoke.    Plan: troponin pending. CBC with leukocytosis, appears chronic but likely also attributed to chronic steroid use. No indication for repeat CTA today. If work-up negative, plan for discharge with cardiology referral.   11:00AM: Patients delta troponins are both <2, labs otherwise reassuring. CXR does show  Question mild bronchitis/reactive airways.   I have personally reviewed and interpreted this imaging and agree with radiology interpretation.  Upon reassessment, patient denies any cough, she is not wheezing. Her symptoms did not improve with steroids previously, no indication to treat this.   Nursing did reach out to me stating that patient asked for pain meds, I did order this and then nursing reached out to me stating the patient was now pain free and did not want pain medication.  I then went to see and discharge the patient less than 5 minutes later and she states she is having pain in fact, discussed normal work-up and plan for cardiology outpatient follow-up given her HEART score of 2, low risk, no indication for obs admission given this. She is also PERC negative and had normal CTA PE study at outside emergency department on 2/23. Her pain has not changed since then, no indication for repeat CT. She then became agitated and states 'I do not trust your judgement' and is requesting to speak with a physician. My attending Dr. Rhunette Croft discussed work-up and plan with the patient and patient was subsequently discharged per  previous providers recommendations. Evaluation and diagnostic testing in the emergency department does not suggest an emergent condition requiring admission or immediate intervention beyond what has been performed at this time.  Plan for discharge with close PCP follow-up.  Patient is understanding and amenable with plan, educated on red flag symptoms that would prompt immediate return.  Patient discharged in stable condition.  This is a shared visit with supervising physician Dr. Rhunette Croft who has independently evaluated patient & provided guidance in evaluation/management/disposition, in agreement with care       Silva Bandy, PA-C 11/17/23 1147    Derwood Kaplan, MD 11/19/23 1123

## 2023-11-17 NOTE — ED Notes (Signed)
 Patient has no pain at this time no pain meds given

## 2023-11-17 NOTE — ED Triage Notes (Signed)
 Pt. Arrives POV c/o chest pain, dizziness shortness of breath. States that this has happened before. She states that she is also having blurred vision and tingling of her limbs. Stroke screen negative. Thinks that she has undiagnosed htn.

## 2023-11-18 DIAGNOSIS — Z3202 Encounter for pregnancy test, result negative: Secondary | ICD-10-CM | POA: Diagnosis not present

## 2023-11-18 DIAGNOSIS — R079 Chest pain, unspecified: Secondary | ICD-10-CM | POA: Diagnosis not present

## 2023-11-19 DIAGNOSIS — J189 Pneumonia, unspecified organism: Secondary | ICD-10-CM | POA: Diagnosis not present

## 2023-12-05 DIAGNOSIS — F411 Generalized anxiety disorder: Secondary | ICD-10-CM | POA: Diagnosis not present

## 2023-12-05 DIAGNOSIS — F330 Major depressive disorder, recurrent, mild: Secondary | ICD-10-CM | POA: Diagnosis not present

## 2023-12-05 DIAGNOSIS — Z6841 Body Mass Index (BMI) 40.0 and over, adult: Secondary | ICD-10-CM | POA: Diagnosis not present

## 2023-12-05 DIAGNOSIS — F1120 Opioid dependence, uncomplicated: Secondary | ICD-10-CM | POA: Diagnosis not present

## 2023-12-23 DIAGNOSIS — E890 Postprocedural hypothyroidism: Secondary | ICD-10-CM | POA: Diagnosis not present

## 2023-12-23 DIAGNOSIS — Z9081 Acquired absence of spleen: Secondary | ICD-10-CM | POA: Diagnosis not present

## 2023-12-23 DIAGNOSIS — N920 Excessive and frequent menstruation with regular cycle: Secondary | ICD-10-CM | POA: Diagnosis not present

## 2023-12-23 DIAGNOSIS — Z9049 Acquired absence of other specified parts of digestive tract: Secondary | ICD-10-CM | POA: Diagnosis not present

## 2023-12-23 DIAGNOSIS — D696 Thrombocytopenia, unspecified: Secondary | ICD-10-CM | POA: Diagnosis not present

## 2023-12-23 DIAGNOSIS — K625 Hemorrhage of anus and rectum: Secondary | ICD-10-CM | POA: Diagnosis not present

## 2023-12-29 DIAGNOSIS — H04123 Dry eye syndrome of bilateral lacrimal glands: Secondary | ICD-10-CM | POA: Diagnosis not present

## 2023-12-29 DIAGNOSIS — J3489 Other specified disorders of nose and nasal sinuses: Secondary | ICD-10-CM | POA: Diagnosis not present

## 2023-12-29 DIAGNOSIS — M2550 Pain in unspecified joint: Secondary | ICD-10-CM | POA: Diagnosis not present

## 2023-12-29 DIAGNOSIS — D693 Immune thrombocytopenic purpura: Secondary | ICD-10-CM | POA: Diagnosis not present

## 2023-12-29 DIAGNOSIS — D696 Thrombocytopenia, unspecified: Secondary | ICD-10-CM | POA: Diagnosis not present

## 2023-12-29 DIAGNOSIS — R682 Dry mouth, unspecified: Secondary | ICD-10-CM | POA: Diagnosis not present

## 2023-12-31 DIAGNOSIS — R079 Chest pain, unspecified: Secondary | ICD-10-CM | POA: Diagnosis not present

## 2023-12-31 DIAGNOSIS — Z880 Allergy status to penicillin: Secondary | ICD-10-CM | POA: Diagnosis not present

## 2023-12-31 DIAGNOSIS — F17210 Nicotine dependence, cigarettes, uncomplicated: Secondary | ICD-10-CM | POA: Diagnosis not present

## 2023-12-31 DIAGNOSIS — Z79890 Hormone replacement therapy (postmenopausal): Secondary | ICD-10-CM | POA: Diagnosis not present

## 2023-12-31 DIAGNOSIS — R1013 Epigastric pain: Secondary | ICD-10-CM | POA: Diagnosis not present

## 2023-12-31 DIAGNOSIS — K219 Gastro-esophageal reflux disease without esophagitis: Secondary | ICD-10-CM | POA: Diagnosis not present

## 2024-01-02 DIAGNOSIS — D693 Immune thrombocytopenic purpura: Secondary | ICD-10-CM | POA: Diagnosis not present

## 2024-01-05 DIAGNOSIS — D693 Immune thrombocytopenic purpura: Secondary | ICD-10-CM | POA: Diagnosis not present

## 2024-01-06 DIAGNOSIS — M79604 Pain in right leg: Secondary | ICD-10-CM | POA: Diagnosis not present

## 2024-01-06 DIAGNOSIS — R7989 Other specified abnormal findings of blood chemistry: Secondary | ICD-10-CM | POA: Diagnosis not present

## 2024-01-06 DIAGNOSIS — M79661 Pain in right lower leg: Secondary | ICD-10-CM | POA: Diagnosis not present

## 2024-01-06 DIAGNOSIS — M79605 Pain in left leg: Secondary | ICD-10-CM | POA: Diagnosis not present

## 2024-01-08 DIAGNOSIS — D693 Immune thrombocytopenic purpura: Secondary | ICD-10-CM | POA: Diagnosis not present

## 2024-01-15 DIAGNOSIS — D693 Immune thrombocytopenic purpura: Secondary | ICD-10-CM | POA: Diagnosis not present

## 2024-01-23 DIAGNOSIS — D693 Immune thrombocytopenic purpura: Secondary | ICD-10-CM | POA: Diagnosis not present

## 2024-01-24 DIAGNOSIS — K219 Gastro-esophageal reflux disease without esophagitis: Secondary | ICD-10-CM | POA: Diagnosis not present

## 2024-01-30 DIAGNOSIS — F1120 Opioid dependence, uncomplicated: Secondary | ICD-10-CM | POA: Diagnosis not present

## 2024-01-30 DIAGNOSIS — Z6841 Body Mass Index (BMI) 40.0 and over, adult: Secondary | ICD-10-CM | POA: Diagnosis not present

## 2024-01-30 DIAGNOSIS — F330 Major depressive disorder, recurrent, mild: Secondary | ICD-10-CM | POA: Diagnosis not present

## 2024-01-30 DIAGNOSIS — Z79899 Other long term (current) drug therapy: Secondary | ICD-10-CM | POA: Diagnosis not present

## 2024-01-30 DIAGNOSIS — F411 Generalized anxiety disorder: Secondary | ICD-10-CM | POA: Diagnosis not present

## 2024-02-08 NOTE — Progress Notes (Signed)
 HEMATOLOGY/ONCOLOGY CLINIC NOTE  Date of Service: 02/09/2024   Patient Care Team: Patient, No Pcp Per as PCP - General (General Practice) Frankie Israel, MD as Consulting Physician (Hematology) Dr. Angelique Barer - [facility]  CHIEF COMPLAINTS/PURPOSE OF CONSULTATION:  Acute on Chronic ITP  HISTORY OF PRESENTING ILLNESS:   Latoya Ayers is a wonderful 48 y.o. female who has been referred to us  by [facility] for transfer of care and for evaluation and management of chronic ITP.   Patient has a history of chronic ITP. She was diagnosed in 2008 and responded to steroids but relapsed with a taper. 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  Patient relapsed for the first time in late 2017 with platelets down to the 30k range and responded with improvement in platelet counts to more than 200k with dexamethasone. She has apparently had multiple treatments with dexamethasone in the last few years without durable response.  She underwent treatment with 4 doses of Rituxan from 3/18 through 12/07/2018 and was in remission for her chronic ITP till recently.  Patient presented to the emergency room at [facility] on 10/06/2020 as a transfer from [place] with 2 days of heavy menstrual vaginal bleeding, headaches, diffuse body aches,  fevers, night sweats and petechiae in the skin and oropharynx.  She was diagnosed with Covid infection in an unvaccinated situation.  Her platelet counts were noted to be 0. She was treated with Lysteda and dexamethasone 40 mg p.o. daily for 4 days. Her platelet counts after steroid dose levels were noted to be more than 100k. She did receive 1 unit of platelets. She did not receive IVIG. Hemoglobin levels were stable at 12.5.  Her chronic ITP relapse was thought to be related to active Covid infection. She did not receive any specific Covid related therapeutics. She did have a CT of the  head which showed no intracranial bleeding.  She has self-referred to be seen by us  for continued management of her chronic ITP. She notes she was very anxious since her platelets dropped to 0 and there was concern for significant bleeding.  She notes that in the last few days she has been taking 20 mg of dexamethasone daily because she was anxious her platelets might have dropped again.  On review of systems, pt reports no further fevers chills or night sweats. No shortness of breath. Resolution of all her Covid symptoms. No new bleeding or bruising. No current vaginal bleeding. No epistaxis hemoptysis hematemesis or GI bleeding. No current headaches.  INTERVAL HISTORY  Latoya Ayers is a wonderful 48 y.o. 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  She was last seen by me on 05/09/2021 and was doing well overall with no acute new symptoms.  D-Dimer level noted to be elevated in early May at 1,398. Venous doppler study on 01/06/2024 showed no findings of DVT in bilateral lower extremities.   Patient is here to reestablish care after her last visit 2 and half to 3 years ago.  She was seen at outside hospital for low platelets of 15k after having had an upper respiratory tract infection.  This infection likely caused acute on chronic ITP which was stable for a long time.  Patient is status post splenectomy.  She was approved for a short course of high-dose steroids and a platelet counts today are normal at 346k. She has previously also  gone into remission with use of steroids. No issues with abnormal bleeding or bruising. She did have some petechiae and the platelets were normal.  Resolved.   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 Patient notes follicular adenoma removed in 2009. She has been on chronic levothyroxine replacement but her recent TSH was noted to be  low at 0.068 and her levothyroxine dose was reduced to 150 mcg daily with a plan to follow-up with her primary care physician.  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 Notes that she is up-to-date with her postsplenectomy vaccinations. Chronic smoker.   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  SOCIAL HISTORY: Social History   Socioeconomic History   Marital status: Married    Spouse name: Not on file   Number of children: Not on file   Years of education: Not on file   Highest education level: Not on file  Occupational History   Not on file  Tobacco Use   Smoking status: Every Day    Current packs/day: 1.00    Types: Cigarettes   Smokeless tobacco: Never  Substance and Sexual Activity   Alcohol use: Yes    Comment: socially   Drug use: No   Sexual activity: Not on file  Other Topics Concern   Not on file  Social History Narrative   Not on file   Social Drivers of Health   Financial Resource Strain: Not on file  Food Insecurity: No Food Insecurity (12/29/2023)   [facility]   Hunger Vital Sign    Within the past 12 months, you worried that your food would run out before you got the money to buy more.: Never true    Within the past 12 months, the food you bought just didn't last and you didn't have money to get more.: Never true  Transportation Needs: No Transportation Needs (12/29/2023)   Received from [facility]   PRAPARE - Transportation    Lack of Transportation (Medical): No    Lack of Transportation (Non-Medical): No  Physical Activity: Not on file  Stress: Stress Concern Present (10/28/2018)   Received from [facility] - Occupational Stress Questionnaire    Feeling of Stress : Rather much  Social  Connections: Not on file  Intimate Partner Violence: Not on file  smoking  vaping 20 puffs a day suboxone  FAMILY HISTORY:  Breast cancer -- family hx  ALLERGIES:  is allergic to penicillins.    MEDICATIONS:  Current Outpatient Medications  Medication Sig Dispense Refill   Buprenorphine HCl-Naloxone HCl 8-2 MG FILM Place under the tongue daily.     levothyroxine (SYNTHROID) 150 MCG tablet Take 112 mcg by mouth.     albuterol (VENTOLIN HFA) 108 (90 Base) MCG/ACT inhaler Inhale 2 puffs into the lungs every 4 (four) hours as needed. (Patient not taking: Reported on 02/09/2024)     cyanocobalamin  1000 MCG tablet Take by mouth.     hydrochlorothiazide (MICROZIDE) 12.5 MG capsule Take by mouth.     Multiple Vitamin (MULTI-VITAMIN) tablet Take 1 tablet by mouth daily. (Patient not taking: Reported on 02/09/2024)     nicotine (NICODERM CQ - DOSED IN MG/24 HOURS) 21 mg/24hr  patch Place onto the skin.     phentermine 37.5 MG capsule Take by mouth.     sulfamethoxazole-trimethoprim (BACTRIM DS) 800-160 MG tablet Take 1 tablet by mouth daily.     tranexamic acid (LYSTEDA) 650 MG TABS tablet      No current facility-administered medications for this visit.    REVIEW OF SYSTEMS:   .10 Point review of Systems was done is negative except as noted above.   PHYSICAL EXAMINATION: ECOG PERFORMANCE STATUS: 1 - Symptomatic but completely ambulatory VSS GENERAL:alert, in no acute distress and comfortable SKIN: no acute rashes, no significant lesions EYES: conjunctiva are pink and non-injected, sclera anicteric OROPHARYNX: MMM, no exudates, no oropharyngeal erythema or ulceration NECK: supple, no JVD LYMPH:  no palpable lymphadenopathy in the cervical, axillary or inguinal regions LUNGS: clear to auscultation b/l with normal respiratory effort HEART: regular rate & rhythm ABDOMEN:  normoactive bowel sounds , non tender, not distended. Extremity: no pedal edema PSYCH: alert & oriented x 3 with  fluent speech NEURO: no focal motor/sensory deficits   LABORATORY DATA:  I have reviewed the data as listed  .    Latest Ref Rng & Units 02/09/2024   10:35 AM 11/17/2023    5:58 AM 11/02/2023    2:46 AM  CBC  WBC 4.0 - 10.5 K/uL 12.8  16.6  15.7   Hemoglobin 12.0 - 15.0 g/dL 16.1  09.6  04.5   Hematocrit 36.0 - 46.0 % 39.9  40.4  40.8   Platelets 150 - 400 K/uL 346  469  445     .    Latest Ref Rng & Units 02/09/2024   10:35 AM 11/17/2023    6:50 AM 11/02/2023    2:46 AM  CMP  Glucose 70 - 99 mg/dL 94  409  811   BUN 6 - 20 mg/dL 7  9  10    Creatinine 0.44 - 1.00 mg/dL 9.14  7.82  9.56   Sodium 135 - 145 mmol/L 139  138  139   Potassium 3.5 - 5.1 mmol/L 4.3  4.1  4.3   Chloride 98 - 111 mmol/L 104  108  103   CO2 22 - 32 mmol/L 32  23  28   Calcium 8.9 - 10.3 mg/dL 8.7  8.0  8.8   Total Protein 6.5 - 8.1 g/dL 7.0     Total Bilirubin 0.0 - 1.2 mg/dL 0.4     Alkaline Phos 38 - 126 U/L 81     AST 15 - 41 U/L 17     ALT 0 - 44 U/L 12      Component     Latest Ref Rng & Units 10/23/2020  Iron     41 - 142 ug/dL 84  TIBC     213 - 086 ug/dL 578  Saturation Ratios     21 - 57 % 21  UIBC     120 - 384 ug/dL 469  Preg, Serum     NEGATIVE NEGATIVE  Vitamin B12     180 - 914 pg/mL 646  T4,Free(Direct)     0.61 - 1.12 ng/dL 6.29  TSH     5.284 - 1.324 uIU/mL 0.233 (L)  Ferritin     11 - 307 ng/mL 42  Immature Platelet Fraction     1.2 - 8.6 % 1.4    RADIOGRAPHIC STUDIES: I have personally reviewed the radiological images as listed and agreed with the findings in the report. No results found.  01/06/2024 PVL Venous Duplex Lower Extremity Bilateral:     ASSESSMENT & PLAN:   48 year old female with history of chronic ITP with details as noted above with  1. S/p Relapse of chronic ITP due to recent COVID-19 infection on 10/06/2020 with platelet counts of 0. She has had recent induction dose of dexamethasone 40 mg p.o. daily for 4 days. And herself decided to repeat  dexamethasone 20 mg daily for additional 3 days until about 1 or 2 days ago.  Platelets today have bounced back to 757k. She has all been able to maintain durable response to steroids in the recent few years.   She was diagnosed in 2008 and responded to steroids but relapsed with a taper. 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  Patient relapsed for the first time in late 2017 with platelets down to the 30k range and responded with improvement in platelet counts to more than 200k with dexamethasone. She has apparently had multiple treatments with dexamethasone in the last few years without durable response.  She underwent treatment with 4 doses of Rituxan from 3/18 through 12/07/2018 and was in remission for her chronic ITP till recently.  PLAN: -Patient seen in hospitalization multiple platelets of 15k after a respiratory infection was noted. -Patient received high-dose steroids and her respiratory infection has resolved. -Platelet counts have improved to normal in tandem with these events. -Labs today show normal platelets of 346k. -Patient with WBC counts are borderline elevated likely from recent use of steroids as well as her postsplenectomy status. -Patient already has had a splenectomy and acute on chronic ITP relapse appears to have resolved with steroids and treatment of her upper respiratory tract infection. -No indication for additional treatment for her ITP at this time  2. History of B12 deficiency B12 level today is at 646 PLan -Continue B12 1000 mcg p.o. daily  4. Hypothyroidism-status post thyroidectomy in 2009 for follicular adenoma. - Continue thyroid  replacement  5. History of opiate abuse currently on chronic opiate agonist therapy with Suboxone being managed by her primary care physician.  6.. 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 Salomon Cree in 2  months with labs 1 day prior to phone visit  The total time spent in the appointment was 30 minutes* .  All of the patient's questions were answered with apparent satisfaction. The patient knows to call the clinic with any problems, questions or concerns.   Jacquelyn Matt MD MS AAHIVMS [facility] Hematology/Oncology Physician [facility]  .*Total Encounter Time as defined by the Centers for Medicare and Medicaid Services includes, in addition to the face-to-face time of a patient visit (documented in the note above) non-face-to-face time: obtaining and reviewing outside history, ordering and reviewing medications, tests or procedures, care coordination (communications with other health care professionals or caregivers) and documentation in the medical record.    I,Mitra Faeizi,acting as a Neurosurgeon for Jacquelyn Matt, MD.,have documented all relevant documentation on the behalf of Jacquelyn Matt, MD,as directed by  Jacquelyn Matt, MD while in the presence of Jacquelyn Matt, MD.  .I have reviewed the above documentation for accuracy and completeness, and I agree with the above. .Gautam Kishore Kale MD

## 2024-02-09 ENCOUNTER — Inpatient Hospital Stay: Attending: Hematology | Admitting: Hematology

## 2024-02-09 ENCOUNTER — Inpatient Hospital Stay

## 2024-02-09 DIAGNOSIS — D693 Immune thrombocytopenic purpura: Secondary | ICD-10-CM | POA: Diagnosis not present

## 2024-02-09 LAB — CBC WITH DIFFERENTIAL (CANCER CENTER ONLY)
Abs Immature Granulocytes: 0.06 10*3/uL (ref 0.00–0.07)
Basophils Absolute: 0.1 10*3/uL (ref 0.0–0.1)
Basophils Relative: 1 %
Eosinophils Absolute: 0.4 10*3/uL (ref 0.0–0.5)
Eosinophils Relative: 3 %
HCT: 39.9 % (ref 36.0–46.0)
Hemoglobin: 12.7 g/dL (ref 12.0–15.0)
Immature Granulocytes: 1 %
Lymphocytes Relative: 33 %
Lymphs Abs: 4.2 10*3/uL — ABNORMAL HIGH (ref 0.7–4.0)
MCH: 27 pg (ref 26.0–34.0)
MCHC: 31.8 g/dL (ref 30.0–36.0)
MCV: 84.7 fL (ref 80.0–100.0)
Monocytes Absolute: 0.9 10*3/uL (ref 0.1–1.0)
Monocytes Relative: 7 %
Neutro Abs: 7.1 10*3/uL (ref 1.7–7.7)
Neutrophils Relative %: 55 %
Platelet Count: 346 10*3/uL (ref 150–400)
RBC: 4.71 MIL/uL (ref 3.87–5.11)
RDW: 16.1 % — ABNORMAL HIGH (ref 11.5–15.5)
WBC Count: 12.8 10*3/uL — ABNORMAL HIGH (ref 4.0–10.5)
nRBC: 0 % (ref 0.0–0.2)

## 2024-02-09 LAB — CMP (CANCER CENTER ONLY)
ALT: 12 U/L (ref 0–44)
AST: 17 U/L (ref 15–41)
Albumin: 3.8 g/dL (ref 3.5–5.0)
Alkaline Phosphatase: 81 U/L (ref 38–126)
Anion gap: 3 — ABNORMAL LOW (ref 5–15)
BUN: 7 mg/dL (ref 6–20)
CO2: 32 mmol/L (ref 22–32)
Calcium: 8.7 mg/dL — ABNORMAL LOW (ref 8.9–10.3)
Chloride: 104 mmol/L (ref 98–111)
Creatinine: 0.7 mg/dL (ref 0.44–1.00)
GFR, Estimated: >60 mL/min (ref >60)
Glucose, Bld: 94 mg/dL (ref 70–99)
Potassium: 4.3 mmol/L (ref 3.5–5.1)
Sodium: 139 mmol/L (ref 135–145)
Total Bilirubin: 0.4 mg/dL (ref 0.0–1.2)
Total Protein: 7 g/dL (ref 6.5–8.1)

## 2024-02-09 LAB — IMMATURE PLATELET FRACTION: Immature Platelet Fraction: 2.9 % (ref 1.2–8.6)

## 2024-02-15 ENCOUNTER — Encounter: Payer: Self-pay | Admitting: Hematology

## 2024-02-19 ENCOUNTER — Encounter (HOSPITAL_COMMUNITY): Payer: Self-pay

## 2024-02-19 ENCOUNTER — Other Ambulatory Visit: Payer: Self-pay

## 2024-02-19 ENCOUNTER — Emergency Department (HOSPITAL_COMMUNITY)

## 2024-02-19 ENCOUNTER — Emergency Department (HOSPITAL_COMMUNITY)
Admission: EM | Admit: 2024-02-19 | Discharge: 2024-02-20 | Disposition: A | Discharge status: Home / Self Care | Attending: Emergency Medicine | Admitting: Emergency Medicine

## 2024-02-19 DIAGNOSIS — R079 Chest pain, unspecified: Secondary | ICD-10-CM | POA: Diagnosis not present

## 2024-02-19 DIAGNOSIS — R0789 Other chest pain: Secondary | ICD-10-CM | POA: Diagnosis not present

## 2024-02-19 DIAGNOSIS — E039 Hypothyroidism, unspecified: Secondary | ICD-10-CM | POA: Insufficient documentation

## 2024-02-19 DIAGNOSIS — D72829 Elevated white blood cell count, unspecified: Secondary | ICD-10-CM | POA: Diagnosis not present

## 2024-02-19 DIAGNOSIS — R0602 Shortness of breath: Secondary | ICD-10-CM | POA: Diagnosis not present

## 2024-02-19 DIAGNOSIS — H538 Other visual disturbances: Secondary | ICD-10-CM | POA: Diagnosis not present

## 2024-02-19 DIAGNOSIS — Z85850 Personal history of malignant neoplasm of thyroid: Secondary | ICD-10-CM | POA: Insufficient documentation

## 2024-02-19 NOTE — ED Triage Notes (Signed)
 Pt states she is having CP, SOB, blurry vision, pain running down her right arm since taking her medicine. Pt states there is something going on at the pharmacy with the medications because when she takes Suboxone she gets these symptoms. States the packaging is different & she called the manufacturer of the drug. Wants to know if we can report that they are tampering with the medications to someone.

## 2024-02-20 LAB — BASIC METABOLIC PANEL WITH GFR
Anion gap: 9 (ref 5–15)
BUN: 5 mg/dL — ABNORMAL LOW (ref 6–20)
CO2: 27 mmol/L (ref 22–32)
Calcium: 8.7 mg/dL — ABNORMAL LOW (ref 8.9–10.3)
Chloride: 98 mmol/L (ref 98–111)
Creatinine, Ser: 0.98 mg/dL (ref 0.44–1.00)
GFR, Estimated: >60 mL/min (ref >60)
Glucose, Bld: 102 mg/dL — ABNORMAL HIGH (ref 70–99)
Potassium: 3.5 mmol/L (ref 3.5–5.1)
Sodium: 134 mmol/L — ABNORMAL LOW (ref 135–145)

## 2024-02-20 LAB — CBC
HCT: 41.2 % (ref 36.0–46.0)
Hemoglobin: 12.9 g/dL (ref 12.0–15.0)
MCH: 27.3 pg (ref 26.0–34.0)
MCHC: 31.3 g/dL (ref 30.0–36.0)
MCV: 87.1 fL (ref 80.0–100.0)
Platelets: 377 10*3/uL (ref 150–400)
RBC: 4.73 MIL/uL (ref 3.87–5.11)
RDW: 15.9 % — ABNORMAL HIGH (ref 11.5–15.5)
WBC: 17.7 10*3/uL — ABNORMAL HIGH (ref 4.0–10.5)
nRBC: 0 % (ref 0.0–0.2)

## 2024-02-20 LAB — HCG, SERUM, QUALITATIVE: Preg, Serum: NEGATIVE

## 2024-02-20 LAB — TROPONIN I (HIGH SENSITIVITY): Troponin I (High Sensitivity): <2 ng/L (ref <18)

## 2024-02-20 NOTE — ED Provider Notes (Signed)
 [place] EMERGENCY DEPARTMENT AT [facility] Provider Note  CSN: [id number] Arrival date & time: 02/19/24 2306  Chief Complaint(s) Chest Pain  HPI Latoya Ayers is a 48 y.o. female here for recurring chest pressure, shortness of breath, blurry vision after taking Suboxone obtain from [facility].  She reports that this has been an ongoing issue over the past several months.  Occurs each time she takes the medicine with the same packaging.  She reports that she went to [place]  within the last few months and got Suboxone from there and did not have any issues.  No recent fevers or infections.  No coughing or congestion.  No nausea vomiting.  No abdominal pain.  No other physical complaints.  The history is provided by the patient.    Past Medical History Past Medical History:  Diagnosis Date   Acute ITP [facility])    Patient Active Problem List   Diagnosis Date Noted   History of opioid abuse (HCC) 12/04/2017   Thyroid  cancer (HCC) 08/07/2016   Chronic ITP (idiopathic thrombocytopenia) (HCC) 07/16/2016   Acne 03/18/2011   Dyspnea 03/15/2011   Leukocytosis 03/15/2011   Hypothyroidism (acquired) 12/18/2010   Thyroid  nodule 01/14/2008   Gout 12/01/2006   Headache 11/03/2006   Ventricular tachycardia (HCC) 10/09/2006   Depression 01/08/2006   Home Medication(s) Prior to Admission medications   Medication Sig Start Date End Date Taking? Authorizing Provider  albuterol (VENTOLIN HFA) 108 (90 Base) MCG/ACT inhaler Inhale 2 puffs into the lungs every 4 (four) hours as needed. Patient not taking: Reported on 02/09/2024 10/09/20   [provider]  Buprenorphine HCl-Naloxone HCl 8-2 MG FILM Place under the tongue daily. 10/13/20   [provider]  cyanocobalamin  1000 MCG tablet Take by mouth.    [provider]  hydrochlorothiazide (MICROZIDE) 12.5 MG capsule Take by mouth.    [provider]  levothyroxine (SYNTHROID) 150 MCG tablet Take 112  mcg by mouth. 10/09/20   [provider]  Multiple Vitamin (MULTI-VITAMIN) tablet Take 1 tablet by mouth daily. Patient not taking: Reported on 02/09/2024    [provider]  nicotine (NICODERM CQ - DOSED IN MG/24 HOURS) 21 mg/24hr patch Place onto the skin. 10/29/18   [provider]  phentermine 37.5 MG capsule Take by mouth.    [provider]  sulfamethoxazole-trimethoprim (BACTRIM DS) 800-160 MG tablet Take 1 tablet by mouth daily. 10/23/17   [provider]  tranexamic acid (LYSTEDA) 650 MG TABS tablet  10/09/20   [provider]                                                                                                                                    Allergies Penicillins  Review of Systems Review of Systems As noted in HPI  Physical Exam Vital Signs  I have reviewed the triage vital signs BP (!) 172/118 (BP Location: Left Arm)  Pulse 83   Temp 98.4 F (36.9 C) (Oral)   Resp 20   Ht 5' 4 (1.626 m)   Wt (!) 145.2 kg   LMP 02/02/2024   SpO2 100%   BMI 54.93 kg/m   Physical Exam Vitals reviewed.  Constitutional:      General: She is not in acute distress.    Appearance: She is well-developed. She is obese. She is not diaphoretic.  HENT:     Head: Normocephalic and atraumatic.     Nose: Nose normal.     Mouth/Throat:     Mouth: No angioedema.   Eyes:     General: No scleral icterus.       Right eye: No discharge.        Left eye: No discharge.     Conjunctiva/sclera: Conjunctivae normal.     Pupils: Pupils are equal, round, and reactive to light.    Cardiovascular:     Rate and Rhythm: Normal rate and regular rhythm.     Heart sounds: No murmur heard.    No friction rub. No gallop.  Pulmonary:     Effort: Pulmonary effort is normal. No respiratory distress.     Breath sounds: Normal breath sounds. No stridor. No rales.  Abdominal:     General: There is no distension.     Palpations: Abdomen is soft.      Tenderness: There is no abdominal tenderness.   Musculoskeletal:        General: No tenderness.     Cervical back: Normal range of motion and neck supple.   Skin:    General: Skin is warm and dry.     Findings: No erythema or rash.   Neurological:     Mental Status: She is alert and oriented to person, place, and time.     ED Results and Treatments Labs (all labs ordered are listed, but only abnormal results are displayed) Labs Reviewed  BASIC METABOLIC PANEL WITH GFR - Abnormal; Notable for the following components:      Result Value   Sodium 134 (*)    Glucose, Bld 102 (*)    BUN 5 (*)    Calcium 8.7 (*)    All other components within normal limits  CBC - Abnormal; Notable for the following components:   WBC 17.7 (*)    RDW 15.9 (*)    All other components within normal limits  HCG, SERUM, QUALITATIVE  TROPONIN I (HIGH SENSITIVITY)                                                                                                                         EKG  EKG Interpretation Date/Time:  Thursday February 19 2024 23:18:19 EDT Ventricular Rate:  81 PR Interval:  164 QRS Duration:  91 QT Interval:  387 QTC Calculation: 450 R Axis:   15  Text Interpretation: Sinus rhythm Low voltage, precordial leads Confirmed by Townsend Freud ([id number]) on 02/20/2024 1:21:55 AM  Radiology DG Chest 2 View Result Date: 02/19/2024 CLINICAL DATA:  Mid chest pain. EXAM: CHEST - 2 VIEW COMPARISON:  November 17, 2023 FINDINGS: The heart size and mediastinal contours are within normal limits. There is no evidence of acute infiltrate, pleural effusion or pneumothorax. Radiopaque surgical clips are seen within the right upper quadrant. The visualized skeletal structures are unremarkable. IMPRESSION: No active cardiopulmonary disease. Electronically Signed   By: Virgle Grime M.D.   On: 02/19/2024 23:55    Medications Ordered in ED Medications - No data to  display Procedures Procedures  (including critical care time) Medical Decision Making / ED Course   Medical Decision Making Amount and/or Complexity of Data Reviewed Labs: ordered. Radiology: ordered.    Chest discomfort Differential diagnosis considered.  Workup below.  Highly atypical for ACS.  EKG without acute ischemic changes or evidence of pericarditis.  Troponin negative.  Heart score less than 3.  Given duration of pain, no need for delta troponin.  Low suspicion for pulmonary embolism.  Presentation not classic for arctic dissection or esophageal perforation.  Chest x-ray without evidence of pneumonia, pneumothorax or pulmonary edema.  CBC with leukocytosis which appears to be chronic when compared to previous.  No anemia.  No electrolyte derangements or renal insufficiency.  Exam is not concerning for anaphylaxis.    Final Clinical Impression(s) / ED Diagnoses Final diagnoses:  Nonspecific chest pain   The patient appears reasonably screened and/or stabilized for discharge and I doubt any other medical condition or other [facility] requiring further screening, evaluation, or treatment in the ED at this time. I have discussed the findings, Dx and Tx plan with the patient/family who expressed understanding and agree(s) with the plan. Discharge instructions discussed at length. The patient/family was given strict return precautions who verbalized understanding of the instructions. No further questions at time of discharge.  Disposition: Discharge  Condition: Good  ED Discharge Orders     None         Follow Up: Primary care provider  Call  to schedule an appointment for close follow up    This chart was dictated using voice recognition software.  Despite best efforts to proofread,  errors can occur which can change the documentation meaning.    Lindle Rhea, MD 02/20/24 [phone]

## 2024-02-23 DIAGNOSIS — D6949 Other primary thrombocytopenia: Secondary | ICD-10-CM | POA: Diagnosis not present

## 2024-02-23 DIAGNOSIS — E039 Hypothyroidism, unspecified: Secondary | ICD-10-CM | POA: Diagnosis not present

## 2024-02-23 DIAGNOSIS — R0789 Other chest pain: Secondary | ICD-10-CM | POA: Diagnosis not present

## 2024-02-23 DIAGNOSIS — F411 Generalized anxiety disorder: Secondary | ICD-10-CM | POA: Diagnosis not present

## 2024-02-23 DIAGNOSIS — R030 Elevated blood-pressure reading, without diagnosis of hypertension: Secondary | ICD-10-CM | POA: Diagnosis not present

## 2024-02-24 DIAGNOSIS — Z6841 Body Mass Index (BMI) 40.0 and over, adult: Secondary | ICD-10-CM | POA: Diagnosis not present

## 2024-02-24 DIAGNOSIS — Z79899 Other long term (current) drug therapy: Secondary | ICD-10-CM | POA: Diagnosis not present

## 2024-02-24 DIAGNOSIS — F411 Generalized anxiety disorder: Secondary | ICD-10-CM | POA: Diagnosis not present

## 2024-02-24 DIAGNOSIS — F330 Major depressive disorder, recurrent, mild: Secondary | ICD-10-CM | POA: Diagnosis not present

## 2024-02-24 DIAGNOSIS — F1120 Opioid dependence, uncomplicated: Secondary | ICD-10-CM | POA: Diagnosis not present

## 2024-02-25 DIAGNOSIS — R079 Chest pain, unspecified: Secondary | ICD-10-CM | POA: Diagnosis not present

## 2024-02-25 DIAGNOSIS — E66813 Obesity, class 3: Secondary | ICD-10-CM | POA: Diagnosis not present

## 2024-02-25 DIAGNOSIS — I10 Essential (primary) hypertension: Secondary | ICD-10-CM | POA: Diagnosis not present

## 2024-02-25 DIAGNOSIS — Z6841 Body Mass Index (BMI) 40.0 and over, adult: Secondary | ICD-10-CM | POA: Diagnosis not present

## 2024-02-26 DIAGNOSIS — R0789 Other chest pain: Secondary | ICD-10-CM | POA: Diagnosis not present

## 2024-02-26 DIAGNOSIS — Z9889 Other specified postprocedural states: Secondary | ICD-10-CM | POA: Diagnosis not present

## 2024-02-26 DIAGNOSIS — R001 Bradycardia, unspecified: Secondary | ICD-10-CM | POA: Diagnosis not present

## 2024-02-26 DIAGNOSIS — E039 Hypothyroidism, unspecified: Secondary | ICD-10-CM | POA: Diagnosis not present

## 2024-03-08 DIAGNOSIS — E039 Hypothyroidism, unspecified: Secondary | ICD-10-CM | POA: Diagnosis not present

## 2024-03-08 DIAGNOSIS — Z1231 Encounter for screening mammogram for malignant neoplasm of breast: Secondary | ICD-10-CM | POA: Diagnosis not present

## 2024-03-08 DIAGNOSIS — E559 Vitamin D deficiency, unspecified: Secondary | ICD-10-CM | POA: Diagnosis not present

## 2024-03-08 DIAGNOSIS — B372 Candidiasis of skin and nail: Secondary | ICD-10-CM | POA: Diagnosis not present

## 2024-03-08 DIAGNOSIS — D6949 Other primary thrombocytopenia: Secondary | ICD-10-CM | POA: Diagnosis not present

## 2024-03-08 DIAGNOSIS — E538 Deficiency of other specified B group vitamins: Secondary | ICD-10-CM | POA: Diagnosis not present

## 2024-03-10 ENCOUNTER — Other Ambulatory Visit: Payer: Self-pay | Admitting: Adult Health

## 2024-03-10 DIAGNOSIS — Z1231 Encounter for screening mammogram for malignant neoplasm of breast: Secondary | ICD-10-CM

## 2024-03-11 ENCOUNTER — Ambulatory Visit
Admission: RE | Admit: 2024-03-11 | Discharge: 2024-03-11 | Disposition: A | Discharge status: Home / Self Care | Source: Ambulatory Visit | Attending: Adult Health | Admitting: Adult Health

## 2024-03-11 DIAGNOSIS — Z1231 Encounter for screening mammogram for malignant neoplasm of breast: Secondary | ICD-10-CM

## 2024-03-19 ENCOUNTER — Ambulatory Visit

## 2024-03-19 VITALS — BP 132/82 | HR 67 | Ht 64.0 in | Wt 324.0 lb

## 2024-03-19 DIAGNOSIS — R079 Chest pain, unspecified: Secondary | ICD-10-CM | POA: Insufficient documentation

## 2024-03-19 DIAGNOSIS — I739 Peripheral vascular disease, unspecified: Secondary | ICD-10-CM | POA: Insufficient documentation

## 2024-03-19 DIAGNOSIS — I10 Essential (primary) hypertension: Secondary | ICD-10-CM | POA: Diagnosis not present

## 2024-03-19 DIAGNOSIS — R002 Palpitations: Secondary | ICD-10-CM | POA: Insufficient documentation

## 2024-03-19 HISTORY — DX: Chest pain, unspecified: R07.9

## 2024-03-19 HISTORY — DX: Palpitations: R00.2

## 2024-03-19 HISTORY — DX: Peripheral vascular disease, unspecified: I73.9

## 2024-03-19 MED ORDER — METOPROLOL TARTRATE 100 MG PO TABS: 100.0000 mg | ORAL_TABLET | Freq: Once | ORAL | 0 refills | Status: DC

## 2024-03-19 NOTE — Assessment & Plan Note (Signed)
 Significant cardiovascular risk factors. Age, anxiety, polysubstance abuse.  ITP.  Proceed with definitive assessment with cardiac CT coronary angiogram to rule out any significant obstructive disease. If this is unremarkable should focus on anxiety management.

## 2024-03-19 NOTE — Patient Instructions (Signed)
 Medication Instructions:  Your physician recommends that you continue on your current medications as directed. Please refer to the Current Medication list given to you today.  *If you need a refill on your cardiac medications before your next appointment, please call your pharmacy*  Lab Work: Your physician recommends that you return for lab work in:   Labs today: BMP  If you have labs (blood work) drawn today and your tests are completely normal, you will receive your results only by: MyChart Message (if you have MyChart) OR A paper copy in the mail If you have any lab test that is abnormal or we need to change your treatment, we will call you to review the results.  Testing/Procedures: A zio monitor was ordered today. It will remain on for 14 days. You will then return monitor and event diary in provided box. It takes 1-2 weeks for report to be downloaded and returned to us . We will call you with the results. If monitor falls off or has orange flashing light, please call Zio for further instructions.   Your physician has requested that you have a lower or upper extremity arterial duplex. This test is an ultrasound of the arteries in the legs or arms. It looks at arterial blood flow in the legs and arms. Allow one hour for Lower and Upper Arterial scans. There are no restrictions or special instructions.  Please note: We ask at that you not bring children with you during ultrasound (echo/ vascular) testing. Due to room size and safety concerns, children are not allowed in the ultrasound rooms during exams. Our front office staff cannot provide observation of children in our lobby area while testing is being conducted. An adult accompanying a patient to their appointment will only be allowed in the ultrasound room at the discretion of the ultrasound technician under special circumstances. We apologize for any inconvenience.     Your cardiac CT will be scheduled at one of the below locations:    [facility] [address] [place], [place] [address] [phone]  OR   [facility] [address] [place], [place] [address] [phone]  OR   [facility] [address] [place], [place] [address] [phone]  OR   [facility] and V[facility] Tower [address]  [place], [place] [address]  OR   [facility] [address] [place], [place]  If scheduled at [facility], please arrive at the [facility] and Children's Entrance (Entrance C2) of [facility] 30 minutes prior to test start time. You can use the FREE valet parking offered at entrance C (encouraged to control the heart rate for the test)  Proceed to the [facility] Radiology Department (first floor) to check-in and test prep.  All radiology patients and guests should use entrance C2 at [facility], accessed from [facility], even though the hospital's physical address listed is [address]  If scheduled at the Heart and Vascular Tower at [address], please enter the parking lot using the [address] entrance and use the FREE valet service at the patient drop-off area. Enter the buidling and check-in with registration on the main floor.  If scheduled at [facility] or [facility], please arrive 15 mins early for check-in and test prep.  There is spacious parking and easy access to the radiology department from the [facility] Heart and Vascular entrance. Please enter here and  check-in with the desk attendant.   If scheduled at [facility], please arrive 30 minutes early for check-in and test prep.  Please follow these instructions carefully (unless otherwise directed):  An IV will be required for this test and Nitroglycerin will be given.  Hold all erectile dysfunction medications at least 3 days (72 hrs) prior to test. (Ie viagra, cialis,  sildenafil, tadalafil, etc)   On the Night Before the Test: Be sure to Drink plenty of water. Do not consume any caffeinated/decaffeinated beverages or chocolate 12 hours prior to your test. Do not take any antihistamines 12 hours prior to your test.  On the Day of the Test: Drink plenty of water until 1 hour prior to the test. Do not eat any food 1 hour prior to test. You may take your regular medications prior to the test.  Take metoprolol (Lopressor) two hours prior to test. Patients who wear a continuous glucose monitor MUST remove the device prior to scanning. FEMALES- please wear underwire-free bra if available, avoid dresses & tight clothing      After the Test: Drink plenty of water. After receiving IV contrast, you may experience a mild flushed feeling. This is normal. On occasion, you may experience a mild rash up to 24 hours after the test. This is not dangerous. If this occurs, you can take Benadryl 25 mg, Zyrtec, Claritin, or Allegra and increase your fluid intake. (Patients taking Tikosyn should avoid Benadryl, and may take Zyrtec, Claritin, or Allegra) If you experience trouble breathing, this can be serious. If it is severe call 911 IMMEDIATELY. If it is mild, please call our office.  We will call to schedule your test 2-4 weeks out understanding that some insurance companies will need an authorization prior to the service being performed.   For more information and frequently asked questions, please visit our website : [email or web address]  For non-scheduling related questions, please contact the cardiac imaging nurse navigator should you have any questions/concerns: Cardiac Imaging Nurse Navigators Direct Office Dial: [phone]   For scheduling needs, including cancellations and rescheduling, please call [place], [phone].   Follow-Up: At [facility], you and your health needs are our priority.  As part of our continuing mission to  provide you with exceptional heart care, our providers are all part of one team.  This team includes your primary Cardiologist (physician) and Advanced Practice Providers or APPs (Physician Assistants and Nurse Practitioners) who all work together to provide you with the care you need, when you need it.  Your next appointment:   2 month(s)  Provider:   Alean Kobus, MD    We recommend signing up for the patient portal called MyChart.  Sign up information is provided on this After Visit Summary.  MyChart is used to connect with patients for Virtual Visits (Telemedicine).  Patients are able to view lab/test results, encounter notes, upcoming appointments, etc.  Non-urgent messages can be sent to your provider as well.   To learn more about what you can do with MyChart, go to [email or web address].   Other Instructions None

## 2024-03-19 NOTE — Assessment & Plan Note (Signed)
 Currently relatively well-controlled. On metoprolol succinate 25 mg once daily. She has some GI symptoms associated with this but do not appear to be directly related to metoprolol as she has had some symptoms even prior with other medications.  At this time recommended her to take hydrochlorothiazide 12.5 mg once a day consistently. Monitor blood pressures at home Target blood pressure below 130/80 mmHg.

## 2024-03-19 NOTE — Progress Notes (Signed)
 Cardiology Consultation:    Date:  03/19/2024   ID:  Latoya Ayers, DOB 1976-08-31, MRN [id number]  PCP:  Patient, No Pcp Per  Cardiologist:  Latoya SAUNDERS Lunna Vogelgesang, MD   Referring MD: Latoya Ayers*   No chief complaint on file.    ASSESSMENT AND PLAN:   Latoya Ayers 48 year old woman hypertension, chronic immune thrombocytopenia diagnosed in 2009 [follows up with oncologist at [facility] long uses steroids for flareups], s/p splenectomy, morbid obesity, anxiety, former tobacco use [quit smoking tobacco in 2023 and quit nicotine vape in March 2025], does not drink alcohol, past history of cocaine and opioid smoking quit in 2009 [remains on Suboxone therapy]. Reports stress test with nuclear imaging and echocardiogram [place]  [facility] in April 2025.   Problem List Items Addressed This Visit     Chest pain of uncertain etiology - Primary   Significant cardiovascular risk factors. Age, anxiety, polysubstance abuse.  ITP.  Proceed with definitive assessment with cardiac CT coronary angiogram to rule out any significant obstructive disease. If this Ayers unremarkable should focus on anxiety management.       Relevant Orders   EKG 12-Lead (Completed)   LONG TERM MONITOR XT (3-14 DAYS)   CT CORONARY MORPH W/CTA COR W/SCORE W/CA W/CM &/OR WO/CM   Basic Metabolic Panel (BMET)   VAS US  LOWER EXTREMITY ARTERIAL DUPLEX   VAS US  ABI WITH/WO TBI   Palpitations   Proceed with Zio patch for 2 weeks.       Relevant Orders   LONG TERM MONITOR XT (3-14 DAYS)   CT CORONARY MORPH W/CTA COR W/SCORE W/CA W/CM &/OR WO/CM   Basic Metabolic Panel (BMET)   VAS US  LOWER EXTREMITY ARTERIAL DUPLEX   VAS US  ABI WITH/WO TBI   Hypertension   Currently relatively well-controlled. On metoprolol succinate 25 mg once daily. She has some GI symptoms associated with this but do not appear to be directly related to metoprolol as she has had some symptoms even prior  with other medications.  At this time recommended her to take hydrochlorothiazide 12.5 mg once a day consistently. Monitor blood pressures at home Target blood pressure below 130/80 mmHg.       Relevant Medications   metoprolol succinate (TOPROL-XL) 25 MG 24 hr tablet   metoprolol tartrate (LOPRESSOR) 100 MG tablet   Other Relevant Orders   LONG TERM MONITOR XT (3-14 DAYS)   CT CORONARY MORPH W/CTA COR W/SCORE W/CA W/CM &/OR WO/CM   Basic Metabolic Panel (BMET)   VAS US  LOWER EXTREMITY ARTERIAL DUPLEX   VAS US  ABI WITH/WO TBI   Claudication in peripheral vascular disease (HCC)   Given symptoms of bilateral lower extremity pain and skin changes while walking.  Will rule out any significant peripheral vascular disease with ABIs      Return to clinic tentatively in 2 months.   History of Present Illness:    Latoya Ayers a 48 y.o. female who Ayers being seen today for the evaluation of chest pain symptoms at the request of Flinchum, Rosaline GORMAN, F*. She recently had evaluation with cardiologist Dr. Loreli at [facility] on 02/25/2024. She also had a visit for chest pain to  the ER on 02/19/2024.  Pleasant woman here for the visit by herself.  Lives at home with her family, has 3 adult children that live with her.  She Ayers currently unemployed.  Keeps herself busy with household activities.  History of hypertension, chronic immune  thrombocytopenia diagnosed in 2009 [follows up with oncologist at [facility] long uses steroids for flareups], s/p splenectomy, morbid obesity, anxiety, former tobacco use [quit smoking tobacco in 2023 and quit nicotine vape in March 2025], does not drink alcohol, past history of cocaine and opioid smoking quit in 2009 [remains on Suboxone therapy]. Reports stress test with nuclear imaging and echocardiogram [place]  [facility] in April 2025.  Dealing with symptoms of chest pain for the last few months. Recent ER visit 02/19/2024,  was for an episode of chest discomfort associated with shortness of breath and blurry vision after taking Suboxone obtain from [facility].  High-sensitivity troponin was unremarkable.  Was not felt to be an acute coronary event and subsequently referred to follow-up with cardiology as outpatient and had seen Dr. Loreli on 02/19/2024.  Tells me that she has been dealing with symptoms of palpitations over the last many months at least since February on and off description of flutter like sensation or skipped heartbeats that can occur for few seconds and at x 4 minutes.  She Ayers extremely concerned by the sensation.  At times associated with lightheadedness/dizziness.  Denies any syncopal episodes.  Mentions symptoms of chest pain that has been on and off for over 6 months and had multiple healthcare visits for evaluation. Describes it as a sensation of sharp feeling or a discomfort in the left precordial area that can occur randomly and can last for various durations.  Affects her life significantly as she gets out of breath with these episodes and extremely anxious. There Ayers no specific way she can reproduce the pain.  No obvious relieving factor.  Has had visits to the ER and evaluation at various times with unremarkable troponins. In April 2025 at [place]  [facility] review [facility] apparently had echocardiogram and stress test with nuclear imaging workup while she was inpatient there for 3 days and reportedly unremarkable I do not have copies of these results to review at this time.  Mentions bilateral lower extremity pain while walking associated with skin changes.  Relieved with rest.  Reports elevated blood pressures occasionally at home up to 160/101 mmHg using an upper arm cuff and at healthcare visits up to systolic 180s. Today in the office blood pressure Ayers well-controlled.  She was prescribed metoprolol succinate 25 mg once daily which she has been using for the past 3 weeks. She  mentions having been prescribed hydrochlorothiazide 12.5 mg once daily which she understood was to be used on an as-needed basis and has not taken any doses in the past week.  History of polysubstance abuse but none recently  EKG in the clinic today shows sinus rhythm heart rate 67/min, PR interval 160 ms, QRS duration 78 ms, QTc 426 ms.  No significant change in comparison to prior EKG from 02/19/2024.  Echocardiogram 08/21/2023 at [facility] health reported LVEF to than 55% with normal biventricular function, IVC size was reported to suggest elevated right atrial pressure between 10 to 20 mmHg.  Venous duplex study from 01/06/2024 at [facility] health care for bilateral lower extremity pain and reported swelling in the setting of diagnosis of thrombocytopenia noted no evidence of DVT in the bilateral femoral, popliteal and posterior tibial veins.  Peroneal veins were poorly visualized  CTA chest 10-26-2018 noted no evidence of pulmonary embolism.  Bilateral dependent atelectasis was reported.  From the heart standpoint multichamber cardiomegaly was reported with right apical groundglass appearance and aorta normal in size. Past Medical History:  Diagnosis Date  Acute ITP (HCC)    Hypertension 03/19/2024    Past Surgical History:  Procedure Laterality Date   LEG SURGERY      Current Medications: Current Meds  Medication Sig   albuterol (VENTOLIN HFA) 108 (90 Base) MCG/ACT inhaler Inhale 2 puffs into the lungs every 4 (four) hours as needed.   buprenorphine (SUBUTEX) 8 MG SUBL SL tablet Place 8 mg under the tongue 2 (two) times daily.   Buprenorphine HCl-Naloxone HCl 8-2 MG FILM Place under the tongue daily.   esomeprazole (NEXIUM) 20 MG capsule Take 20 mg by mouth as needed (heartburn).   hydrOXYzine (ATARAX) 25 MG tablet Take 25-50 mg by mouth 3 (three) times daily as needed.   ketoconazole (NIZORAL) 2 % cream Apply topically as needed for irritation.   levothyroxine (SYNTHROID) 150 MCG tablet Take  112 mcg by mouth.   metoprolol succinate (TOPROL-XL) 25 MG 24 hr tablet Take 25 mg by mouth daily.   metoprolol tartrate (LOPRESSOR) 100 MG tablet Take 1 tablet (100 mg total) by mouth once for 1 dose. Please take this medication 2 hours before CT   Multiple Vitamin (MULTI-VITAMIN) tablet Take 1 tablet by mouth daily.   sulfamethoxazole-trimethoprim (BACTRIM DS) 800-160 MG tablet Take 1 tablet by mouth daily.   Vitamin D, Ergocalciferol, (DRISDOL) 1.25 MG (50000 UNIT) CAPS capsule Take 50,000 Units by mouth once a week.     Allergies:   Penicillins   Social History   Socioeconomic History   Marital status: Married    Spouse name: Not on file   Number of children: Not on file   Years of education: Not on file   Highest education level: Not on file  Occupational History   Not on file  Tobacco Use   Smoking status: Every Day    Current packs/day: 1.00    Types: Cigarettes   Smokeless tobacco: Never  Substance and Sexual Activity   Alcohol use: Yes    Comment: socially   Drug use: No   Sexual activity: Not on file  Other Topics Concern   Not on file  Social History Narrative   Not on file   Social Drivers of Health   Financial Resource Strain: Not on file  Food Insecurity: No Food Insecurity (12/29/2023)   Received from [facility]   Hunger Vital Sign    Within the past 12 months, you worried that your food would run out before you got the money to buy more.: Never true    Within the past 12 months, the food you bought just didn't last and you didn't have money to get more.: Never true  Transportation Needs: No Transportation Needs (12/29/2023)   Received from [facility]   PRAPARE - Transportation    Lack of Transportation (Medical): No    Lack of Transportation (Non-Medical): No  Physical Activity: Not on file  Stress: Stress Concern Present (10/28/2018)   Received from [facility] of Occupational Health - Occupational Stress Questionnaire     Feeling of Stress : Rather much  Social Connections: Not on file     Family History: The patient's family history includes Breast cancer (age of onset: 71 - 34) in her maternal aunt; Breast cancer (age of onset: 40) in her mother. ROS:   Please see the history of present illness.    All 14 point review of systems negative except as described per history of present illness.  EKGs/Labs/Other Studies Reviewed:  The following studies were reviewed today:   EKG:  EKG Interpretation Date/Time:  Friday March 19 2024 91:[id number] EDT Ventricular Rate:  67 PR Interval:  160 QRS Duration:  78 QT Interval:  404 QTC Calculation: 426 R Axis:   -1  Text Interpretation: Normal sinus rhythm When compared with ECG of 19-Feb-2024 23:18, PREVIOUS ECG Ayers PRESENT Confirmed by Liborio Hai reddy [phone]) on 03/19/2024 8:32:17 AM    Recent Labs: 11/17/2023: B Natriuretic Peptide 86.1 02/09/2024: ALT 12 02/20/2024: BUN 5; Creatinine, Ser 0.98; Hemoglobin 12.9; Platelets 377; Potassium 3.5; Sodium 134  Recent Lipid Panel No results found for: CHOL, TRIG, HDL, CHOLHDL, VLDL, LDLCALC, LDLDIRECT  Physical Exam:    VS:  BP 132/82 (BP Location: Right Arm)   Pulse 67   Ht 5' 4 (1.626 m)   Wt (!) 324 lb (147 kg)   LMP 03/08/2024   SpO2 96%   BMI 55.61 kg/m     Wt Readings from Last 3 Encounters:  03/19/24 (!) 324 lb (147 kg)  02/19/24 (!) 320 lb (145.2 kg)  11/17/23 (!) 330 lb (149.7 kg)     GENERAL:  Well nourished, well developed in no acute distress NECK: No JVD; No carotid bruits CARDIAC: RRR, S1 and S2 present, no murmurs, no rubs, no gallops CHEST:  Clear to auscultation without rales, wheezing or rhonchi  Extremities: No pitting pedal edema. Pulses bilaterally symmetric with radial 2+ and dorsalis pedis 2+ NEUROLOGIC:  Alert and oriented x 3  Medication Adjustments/Labs and Tests Ordered: Current medicines are reviewed at length with the patient today.  Concerns  regarding medicines are outlined above.  Orders Placed This Encounter  Procedures   CT CORONARY MORPH W/CTA COR W/SCORE W/CA W/CM &/OR WO/CM   Basic Metabolic Panel (BMET)   LONG TERM MONITOR XT (3-14 DAYS)   EKG 12-Lead   VAS US  LOWER EXTREMITY ARTERIAL DUPLEX   VAS US  ABI WITH/WO TBI   Meds ordered this encounter  Medications   metoprolol tartrate (LOPRESSOR) 100 MG tablet    Sig: Take 1 tablet (100 mg total) by mouth once for 1 dose. Please take this medication 2 hours before CT    Dispense:  1 tablet    Refill:  0    Signed, Mica Releford reddy Talton Delpriore, MD, MPH, [facility]. 03/19/2024 9:19 AM    [facility]

## 2024-03-19 NOTE — Assessment & Plan Note (Signed)
 Proceed with Zio patch for 2 weeks.

## 2024-03-19 NOTE — Assessment & Plan Note (Addendum)
 Given symptoms of bilateral lower extremity pain and skin changes while walking.  Will rule out any significant peripheral vascular disease with ABIs

## 2024-03-20 ENCOUNTER — Ambulatory Visit: Payer: Self-pay

## 2024-03-20 LAB — BASIC METABOLIC PANEL WITH GFR
BUN/Creatinine Ratio: 9 (ref 9–23)
BUN: 6 mg/dL (ref 6–24)
CO2: 23 mmol/L (ref 20–29)
Calcium: 8.7 mg/dL (ref 8.7–10.2)
Chloride: 102 mmol/L (ref 96–106)
Creatinine, Ser: 0.7 mg/dL (ref 0.57–1.00)
Glucose: 103 mg/dL — ABNORMAL HIGH (ref 70–99)
Potassium: 4.4 mmol/L (ref 3.5–5.2)
Sodium: 142 mmol/L (ref 134–144)
eGFR: 109 mL/min/1.73 (ref >59)

## 2024-03-23 DIAGNOSIS — F1120 Opioid dependence, uncomplicated: Secondary | ICD-10-CM | POA: Diagnosis not present

## 2024-03-23 DIAGNOSIS — Z6841 Body Mass Index (BMI) 40.0 and over, adult: Secondary | ICD-10-CM | POA: Diagnosis not present

## 2024-03-23 DIAGNOSIS — F411 Generalized anxiety disorder: Secondary | ICD-10-CM | POA: Diagnosis not present

## 2024-03-23 DIAGNOSIS — F330 Major depressive disorder, recurrent, mild: Secondary | ICD-10-CM | POA: Diagnosis not present

## 2024-03-23 DIAGNOSIS — Z79899 Other long term (current) drug therapy: Secondary | ICD-10-CM | POA: Diagnosis not present

## 2024-03-25 ENCOUNTER — Telehealth: Payer: Self-pay

## 2024-03-25 NOTE — Telephone Encounter (Signed)
 Patient came by to have Zio checked. It was attached and no lights were flashing. Advised to continue to wear it and if she sees lights flashing or if it falls off to call the Zio company. She verbalized understanding and had no further questions.

## 2024-03-26 ENCOUNTER — Telehealth: Payer: Self-pay

## 2024-03-26 NOTE — Telephone Encounter (Signed)
 Received the following message from Dr. Liborio regarding the patient's lab results:  The results from your blood work show normal electrolytes and kidney function. Please do not hesitate to contact my office with any questions.  Thank you  Patient informed of results and had no further questions at this time.

## 2024-04-07 ENCOUNTER — Ambulatory Visit

## 2024-04-09 ENCOUNTER — Other Ambulatory Visit: Payer: Self-pay

## 2024-04-09 DIAGNOSIS — D693 Immune thrombocytopenic purpura: Secondary | ICD-10-CM

## 2024-04-12 ENCOUNTER — Inpatient Hospital Stay

## 2024-04-12 DIAGNOSIS — E039 Hypothyroidism, unspecified: Secondary | ICD-10-CM | POA: Diagnosis not present

## 2024-04-12 DIAGNOSIS — Z124 Encounter for screening for malignant neoplasm of cervix: Secondary | ICD-10-CM | POA: Diagnosis not present

## 2024-04-12 DIAGNOSIS — B372 Candidiasis of skin and nail: Secondary | ICD-10-CM | POA: Diagnosis not present

## 2024-04-12 DIAGNOSIS — R3915 Urgency of urination: Secondary | ICD-10-CM | POA: Diagnosis not present

## 2024-04-12 DIAGNOSIS — D6949 Other primary thrombocytopenia: Secondary | ICD-10-CM | POA: Diagnosis not present

## 2024-04-14 ENCOUNTER — Telehealth: Payer: Self-pay | Admitting: Hematology

## 2024-04-14 ENCOUNTER — Inpatient Hospital Stay: Admitting: Hematology

## 2024-04-14 NOTE — Progress Notes (Signed)
 Patient not contactable for phone visit today Attempted to call x 4 and left VM to call back to discuss results.    Emaline Saran MD MSThis encounter was created in error - please disregard.

## 2024-04-15 ENCOUNTER — Encounter (HOSPITAL_COMMUNITY): Payer: Self-pay

## 2024-04-16 ENCOUNTER — Inpatient Hospital Stay: Admitting: Hematology

## 2024-04-16 DIAGNOSIS — R079 Chest pain, unspecified: Secondary | ICD-10-CM | POA: Diagnosis not present

## 2024-04-16 DIAGNOSIS — I10 Essential (primary) hypertension: Secondary | ICD-10-CM | POA: Diagnosis not present

## 2024-04-16 DIAGNOSIS — R002 Palpitations: Secondary | ICD-10-CM

## 2024-04-16 NOTE — Progress Notes (Signed)
 Patients 2nd no show for phone visit. No further phone visits will be scheduled. This encounter was created in error - please disregard.

## 2024-04-19 ENCOUNTER — Telehealth: Payer: Self-pay

## 2024-04-19 NOTE — Telephone Encounter (Signed)
 Patient came by office due to being unable to get through to the call center. She states that her BP is still high even with starting the new BP meds. She is requesting her BP meds be increased. She also needs a refill on hydrochlorothiazide  sent to [facility].  Please contact patient to let her know if she needs to increase meds. The best phone number to reach her is [phone].

## 2024-04-20 DIAGNOSIS — F411 Generalized anxiety disorder: Secondary | ICD-10-CM | POA: Diagnosis not present

## 2024-04-20 DIAGNOSIS — Z79899 Other long term (current) drug therapy: Secondary | ICD-10-CM | POA: Diagnosis not present

## 2024-04-20 DIAGNOSIS — F1120 Opioid dependence, uncomplicated: Secondary | ICD-10-CM | POA: Diagnosis not present

## 2024-04-20 DIAGNOSIS — F330 Major depressive disorder, recurrent, mild: Secondary | ICD-10-CM | POA: Diagnosis not present

## 2024-04-20 DIAGNOSIS — Z6841 Body Mass Index (BMI) 40.0 and over, adult: Secondary | ICD-10-CM | POA: Diagnosis not present

## 2024-04-20 NOTE — Telephone Encounter (Signed)
 Dr. Madireddy's reply:  However provide readings of the blood pressures. From chart review she is only on hydrochlorothiazide  12.5 mg once daily apart from metoprolol  for blood pressure control. If blood pressures are above the target of 130/80 mmHg, titrate up the dose of hydrochlorothiazide  to 25 mg once daily. Thank you

## 2024-04-21 ENCOUNTER — Encounter: Payer: Self-pay | Admitting: Hematology

## 2024-04-22 ENCOUNTER — Ambulatory Visit

## 2024-04-22 VITALS — BP 144/98 | HR 75 | Ht 64.0 in | Wt 320.6 lb

## 2024-04-22 DIAGNOSIS — I10 Essential (primary) hypertension: Secondary | ICD-10-CM

## 2024-04-22 DIAGNOSIS — R079 Chest pain, unspecified: Secondary | ICD-10-CM | POA: Diagnosis not present

## 2024-04-22 MED ORDER — AMLODIPINE BESYLATE 2.5 MG PO TABS: 2.5000 mg | ORAL_TABLET | Freq: Every day | ORAL | 3 refills | Status: DC

## 2024-04-22 NOTE — Progress Notes (Addendum)
 Cardiology Consultation:    Date:  04/22/2024   ID:  Latoya Ayers, DOB [birth year], MRN [id number]  PCP:  Patient, No Pcp Per  Cardiologist:  Alean SAUNDERS Draeden Kellman, MD   Referring MD: No ref. provider found   No chief complaint on file.    ASSESSMENT AND PLAN:   Latoya Ayers 48 year old woman history of  hypertension, chronic immune thrombocytopenia diagnosed in 2009 [follows up with oncologist at [facility] long uses steroids for flareups], s/p splenectomy, morbid obesity, anxiety, former tobacco use [quit smoking tobacco in 2023 and quit nicotine vape in March 2025], does not drink alcohol, past history of cocaine and opioid smoking quit in 2009 [remains on Suboxone therapy].  Reports OSA diagnosed on sleep study at home but had no subsequent follow-up. Reports stress test with nuclear imaging and echocardiogram [place]  [facility] in April 2025 that were reportedly unremarkable.  [Prior to that echocardiogram December 2024 at [facility] health with normal biventricular function LVEF 55%]   Here for follow-up visit for blood pressure control.  Problem List Items Addressed This Visit     Chest pain of uncertain etiology   Reviewed once again and the symptoms and similar to as discussed at last office visit. Proceed with cardiac CT coronary angiogram as ordered at last office visit currently pending to be scheduled.      Hypertension - Primary   Suboptimal. Target below 130/80 mmHg. Continue hydrochlorothiazide  25 mg once daily. Discussed further optimization with beta-blockers such as carvedilol versus calcium channel blockers.  She preferred to hold off on beta-blockers as while she was on metoprolol  succinate she felt weird sensation of chest discomfort and blood flow like sensation in the ears.  Agreeable to start amlodipine  2.5 mg once daily. Discussed the mechanism and potential side effects such as ankle edema. Will send a prescription.       Relevant Medications   hydrochlorothiazide  (MICROZIDE ) 12.5 MG capsule   amLODipine  (NORVASC ) 2.5 MG tablet   Return to clinic tentatively for follow-up in 3 months.   History of Present Illness:    Latoya Ayers is a 48 y.o. female who is being seen today for follow-up visit. Last visit with me in the office was 03/19/2024.  Pleasant woman here for the visit by herself. Lives at home with her family, has 3 adult children that live with her. She is currently unemployed. Keeps herself busy with household activities  Has history of  hypertension, chronic immune thrombocytopenia diagnosed in 2009 [follows up with oncologist at [facility] long uses steroids for flareups], s/p splenectomy, morbid obesity, anxiety, former tobacco use [quit smoking tobacco in 2023 and quit nicotine vape in March 2025], does not drink alcohol, past history of cocaine and opioid smoking quit in 2009 [remains on Suboxone therapy].  Reports OSA diagnosed on sleep study at home but had no subsequent follow-up. Reports stress test with nuclear imaging and echocardiogram [place]  [facility] in April 2025 that were reportedly unremarkable.  [Prior to that echocardiogram December 2024 at [facility] health with normal biventricular function LVEF 55%]  Was seen for further evaluation of symptoms of chest discomfort, palpitations.  Also has been dealing with uncontrolled blood pressures.  Heart monitor, cardiac CT coronary angiogram, arterial duplex of bilateral lower extremity requested.  7-day heart monitor study from 03/20/2024 noted predominantly sinus rhythm with average heart rate 66/min, rare ventricular and supraventricular ectopy burden less than 1%, patient triggered events correlated mostly with normal heart rate and  rhythm and at times with isolated ventricular and supraventricular ectopic beats.  Otherwise no sustained arrhythmias, high-grade AV blocks or pauses.  CT coronary angiogram is  currently pending.  Mentions blood pressures at home have been elevated. She self discontinued metoprolol  succinate. Titrated up hydrochlorothiazide  dose to 25 mg once a day 2 days ago.  Reviewed heart monitor results with her and reassured that there were no significant abnormalities.  Mentions she continues to feel symptoms of bilateral lower extremity pain.  Continues to have atypical chest discomfort.  Drinks caffeinated sodas.    Past Medical History:  Diagnosis Date   Acne 03/18/2011   Acute ITP (HCC)    Chest pain of uncertain etiology 03/19/2024   Claudication in peripheral vascular disease (HCC) 03/19/2024   Depression 01/08/2006   Dyspnea 03/15/2011   Gout 12/01/2006   Headache 11/03/2006   History of opioid abuse (HCC) 12/04/2017   Hypertension 03/19/2024   Hypothyroidism (acquired) 12/18/2010   Influenza 10/29/2018   Leukocytosis 03/15/2011   Palpitations 03/19/2024   Thyroid  cancer (HCC) 08/07/2016   Thyroid  nodule 01/14/2008   Ventricular tachycardia (HCC) 10/09/2006    Past Surgical History:  Procedure Laterality Date   LEG SURGERY      Current Medications: Current Meds  Medication Sig   amLODipine  (NORVASC ) 2.5 MG tablet Take 1 tablet (2.5 mg total) by mouth daily.   buprenorphine (SUBUTEX) 8 MG SUBL SL tablet Place 8 mg under the tongue 2 (two) times daily.   esomeprazole (NEXIUM) 20 MG capsule Take 20 mg by mouth as needed (heartburn).   hydrochlorothiazide  (MICROZIDE ) 12.5 MG capsule Take 12.5 mg by mouth daily.   hydrOXYzine (ATARAX) 25 MG tablet Take 25-50 mg by mouth 3 (three) times daily as needed.   ketoconazole (NIZORAL) 2 % cream Apply topically as needed for irritation.   levothyroxine (SYNTHROID) 150 MCG tablet Take 112 mcg by mouth.   Multiple Vitamin (MULTI-VITAMIN) tablet Take 1 tablet by mouth daily.   [DISCONTINUED] metoprolol  succinate (TOPROL -XL) 25 MG 24 hr tablet Take 25 mg by mouth daily.     Allergies:   Penicillins    Social History   Socioeconomic History   Marital status: Married    Spouse name: Not on file   Number of children: Not on file   Years of education: Not on file   Highest education level: Not on file  Occupational History   Not on file  Tobacco Use   Smoking status: Every Day    Current packs/day: 1.00    Types: Cigarettes   Smokeless tobacco: Never  Substance and Sexual Activity   Alcohol use: Yes    Comment: socially   Drug use: No   Sexual activity: Not on file  Other Topics Concern   Not on file  Social History Narrative   Not on file   Social Drivers of Health   Financial Resource Strain: Not on file  Food Insecurity: No Food Insecurity (12/29/2023)   Received from [facility]   Hunger Vital Sign    Within the past 12 months, you worried that your food would run out before you got the money to buy more.: Never true    Within the past 12 months, the food you bought just didn't last and you didn't have money to get more.: Never true  Transportation Needs: No Transportation Needs (12/29/2023)   Received from [facility]   PRAPARE - Transportation    Lack of Transportation (Medical): No    Lack  of Transportation (Non-Medical): No  Physical Activity: Not on file  Stress: Stress Concern Present (10/28/2018)   Received from [facility] of Occupational Health - Occupational Stress Questionnaire    Feeling of Stress : Rather much  Social Connections: Not on file     Family History: The patient's family history includes Breast cancer (age of onset: 71 - 45) in her maternal aunt; Breast cancer (age of onset: 74) in her mother. ROS:   Please see the history of present illness.    All 14 point review of systems negative except as described per history of present illness.  EKGs/Labs/Other Studies Reviewed:    The following studies were reviewed today:   EKG:       Recent Labs: 11/17/2023: B Natriuretic Peptide 86.1 02/09/2024: ALT  12 02/20/2024: Hemoglobin 12.9; Platelets 377 03/19/2024: BUN 6; Creatinine, Ser 0.70; Potassium 4.4; Sodium 142  Recent Lipid Panel No results found for: CHOL, TRIG, HDL, CHOLHDL, VLDL, LDLCALC, LDLDIRECT  Physical Exam:    VS:  BP (!) 144/98   Pulse 75   Ht 5' 4 (1.626 m)   Wt (!) 320 lb 9.6 oz (145.4 kg)   SpO2 99%   BMI 55.03 kg/m     Wt Readings from Last 3 Encounters:  04/22/24 (!) 320 lb 9.6 oz (145.4 kg)  03/19/24 (!) 324 lb (147 kg)  02/19/24 (!) 320 lb (145.2 kg)     GENERAL:  Well nourished, well developed in no acute distress  CARDIAC: RRR, S1 and S2 present, no murmurs, no rubs, no gallops CHEST:  Clear to auscultation without rales, wheezing or rhonchi  Extremities: No pitting pedal edema. Pulses bilaterally symmetric with radial 2+ and dorsalis pedis 2+ NEUROLOGIC:  Alert and oriented x 3  Medication Adjustments/Labs and Tests Ordered: Current medicines are reviewed at length with the patient today.  Concerns regarding medicines are outlined above.  No orders of the defined types were placed in this encounter.  Meds ordered this encounter  Medications   amLODipine  (NORVASC ) 2.5 MG tablet    Sig: Take 1 tablet (2.5 mg total) by mouth daily.    Dispense:  90 tablet    Refill:  3    Signed, Lucille Witts reddy Xerxes Agrusa, MD, MPH, [facility]. 04/22/2024 4:44 PM    [facility]

## 2024-04-22 NOTE — Assessment & Plan Note (Signed)
 Suboptimal. Target below 130/80 mmHg. Continue hydrochlorothiazide  25 mg once daily. Discussed further optimization with beta-blockers such as carvedilol versus calcium channel blockers.  She preferred to hold off on beta-blockers as while she was on metoprolol  succinate she felt weird sensation of chest discomfort and blood flow like sensation in the ears.  Agreeable to start amlodipine  2.5 mg once daily. Discussed the mechanism and potential side effects such as ankle edema. Will send a prescription.

## 2024-04-22 NOTE — Assessment & Plan Note (Signed)
 Reviewed once again and the symptoms and similar to as discussed at last office visit. Proceed with cardiac CT coronary angiogram as ordered at last office visit currently pending to be scheduled.

## 2024-04-22 NOTE — Patient Instructions (Signed)
 Medication Instructions:  Your physician has recommended you make the following change in your medication:   START: Amlodipine  2.5 mg daily STOP: Metoprolol   *If you need a refill on your cardiac medications before your next appointment, please call your pharmacy*  Lab Work: None If you have labs (blood work) drawn today and your tests are completely normal, you will receive your results only by: MyChart Message (if you have MyChart) OR A paper copy in the mail If you have any lab test that is abnormal or we need to change your treatment, we will call you to review the results.  Testing/Procedures: None  Follow-Up: At [facility], you and your health needs are our priority.  As part of our continuing mission to provide you with exceptional heart care, our providers are all part of one team.  This team includes your primary Cardiologist (physician) and Advanced Practice Providers or APPs (Physician Assistants and Nurse Practitioners) who all work together to provide you with the care you need, when you need it.  Your next appointment:   3 month(s)  Provider:   Alean Kobus, MD    We recommend signing up for the patient portal called MyChart.  Sign up information is provided on this After Visit Summary.  MyChart is used to connect with patients for Virtual Visits (Telemedicine).  Patients are able to view lab/test results, encounter notes, upcoming appointments, etc.  Non-urgent messages can be sent to your provider as well.   To learn more about what you can do with MyChart, go to [email or web address].   Other Instructions Follow up with PCP regarding sleep apnea  Please keep a BP log for 2 weeks and send by MyChart or mail.                          Name and DOB__________________________ Dr. Madireddy [address] [place], [place] [address]  Blood Pressure Record Sheet To take your blood pressure, you will need a blood pressure machine. You can buy a blood  pressure machine (blood pressure monitor) at your clinic, drug store, or online. When choosing one, consider: An automatic monitor that has an arm cuff. A cuff that wraps snugly around your upper arm. You should be able to fit only one finger between your arm and the cuff. A device that stores blood pressure reading results. Do not choose a monitor that measures your blood pressure from your wrist or finger. Follow your health care provider's instructions for how to take your blood pressure. To use this form: Get one reading in the morning (a.m.) 1-2 hours after you take any medicines. Get one reading in the evening (p.m.) before supper.   Blood pressure log Date: _______________________  a.m. _____________________(1st reading) HR___________            p.m. _____________________(2nd reading) HR__________  Date: _______________________  a.m. _____________________(1st reading) HR___________            p.m. _____________________(2nd reading) HR__________  Date: _______________________  a.m. _____________________(1st reading) HR___________            p.m. _____________________(2nd reading) HR__________  Date: _______________________  a.m. _____________________(1st reading) HR___________            p.m. _____________________(2nd reading) HR__________  Date: _______________________  a.m. _____________________(1st reading) HR___________            p.m. _____________________(2nd reading) HR__________  Date: _______________________  a.m. _____________________(1st reading) HR___________  p.m. _____________________(2nd reading) HR__________  Date: _______________________  a.m. _____________________(1st reading) HR___________            p.m. _____________________(2nd reading) HR__________   This information is not intended to replace advice given to you by your health care provider. Make sure you discuss any questions you have with your health care  provider. Document Revised: 12/08/2019 Document Reviewed: 12/08/2019 Elsevier Patient Education  2021 [employer].

## 2024-04-23 ENCOUNTER — Telehealth: Payer: Self-pay

## 2024-04-23 MED ORDER — HYDROCHLOROTHIAZIDE 12.5 MG PO CAPS: 12.5000 mg | ORAL_CAPSULE | Freq: Every day | ORAL | 3 refills | Status: AC

## 2024-04-23 NOTE — Telephone Encounter (Signed)
 Pt's medication was sent to pt's pharmacy as requested. Confirmation received.

## 2024-04-23 NOTE — Telephone Encounter (Signed)
*  STAT* If patient is at the pharmacy, call can be transferred to refill team.   1. Which medications need to be refilled? (please list name of each medication and dose if known)   hydrochlorothiazide  (MICROZIDE ) 12.5 MG capsule    2. Which pharmacy/location (including street and city if local pharmacy) is medication to be sent to?  [facility] - [place], [place] - [place], [place] - [address]      3. Do they need a 30 day or 90 day supply? 90   Pt was seen yesterday and was told prescription would be sent in after but it hasn't been.

## 2024-04-23 NOTE — Telephone Encounter (Signed)
 Pt c/o medication issue:  1. Name of Medication: amLODipine  (NORVASC ) 2.5 MG tablet   2. How are you currently taking this medication (dosage and times per day)?  Take 1 tablet (2.5 mg total) by mouth daily.      3. Are you having a reaction (difficulty breathing--STAT)? No  4. What is your medication issue? Pt was seen yesterday and stated she was advised to start this medication but she's decided she will not be taking it due to side effect of it possibly causing a heart attack. Please advise.

## 2024-04-26 NOTE — Telephone Encounter (Signed)
 Attempted to call the patient on 2 different phone numbers. Patient did not answer and a message was left for the patient to call back.

## 2024-04-27 NOTE — Telephone Encounter (Signed)
 Left vm to return call.

## 2024-04-28 ENCOUNTER — Inpatient Hospital Stay

## 2024-04-28 ENCOUNTER — Inpatient Hospital Stay: Attending: Hematology | Admitting: Hematology

## 2024-04-28 VITALS — BP 137/93 | HR 92 | Temp 97.9°F | Resp 20 | Wt 321.8 lb

## 2024-04-28 DIAGNOSIS — Z01818 Encounter for other preprocedural examination: Secondary | ICD-10-CM | POA: Diagnosis not present

## 2024-04-28 DIAGNOSIS — F17210 Nicotine dependence, cigarettes, uncomplicated: Secondary | ICD-10-CM | POA: Insufficient documentation

## 2024-04-28 DIAGNOSIS — D693 Immune thrombocytopenic purpura: Secondary | ICD-10-CM | POA: Diagnosis not present

## 2024-04-28 LAB — CBC WITH DIFFERENTIAL (CANCER CENTER ONLY)
Abs Immature Granulocytes: 0.05 K/uL (ref 0.00–0.07)
Basophils Absolute: 0.1 K/uL (ref 0.0–0.1)
Basophils Relative: 1 %
Eosinophils Absolute: 0.3 K/uL (ref 0.0–0.5)
Eosinophils Relative: 2 %
HCT: 40.8 % (ref 36.0–46.0)
Hemoglobin: 13.5 g/dL (ref 12.0–15.0)
Immature Granulocytes: 0 %
Lymphocytes Relative: 37 %
Lymphs Abs: 5.7 K/uL — ABNORMAL HIGH (ref 0.7–4.0)
MCH: 27.4 pg (ref 26.0–34.0)
MCHC: 33.1 g/dL (ref 30.0–36.0)
MCV: 82.9 fL (ref 80.0–100.0)
Monocytes Absolute: 0.9 K/uL (ref 0.1–1.0)
Monocytes Relative: 6 %
Neutro Abs: 8.3 K/uL — ABNORMAL HIGH (ref 1.7–7.7)
Neutrophils Relative %: 54 %
Platelet Count: 305 K/uL (ref 150–400)
RBC: 4.92 MIL/uL (ref 3.87–5.11)
RDW: 15.5 % (ref 11.5–15.5)
WBC Count: 15.4 K/uL — ABNORMAL HIGH (ref 4.0–10.5)
nRBC: 0 % (ref 0.0–0.2)

## 2024-04-28 LAB — CMP (CANCER CENTER ONLY)
ALT: 18 U/L (ref 0–44)
AST: 24 U/L (ref 15–41)
Albumin: 3.7 g/dL (ref 3.5–5.0)
Alkaline Phosphatase: 81 U/L (ref 38–126)
Anion gap: 4 — ABNORMAL LOW (ref 5–15)
BUN: 9 mg/dL (ref 6–20)
CO2: 30 mmol/L (ref 22–32)
Calcium: 9 mg/dL (ref 8.9–10.3)
Chloride: 102 mmol/L (ref 98–111)
Creatinine: 0.7 mg/dL (ref 0.44–1.00)
GFR, Estimated: >60 mL/min (ref >60)
Glucose, Bld: 101 mg/dL — ABNORMAL HIGH (ref 70–99)
Potassium: 4 mmol/L (ref 3.5–5.1)
Sodium: 136 mmol/L (ref 135–145)
Total Bilirubin: 0.5 mg/dL (ref 0.0–1.2)
Total Protein: 7.3 g/dL (ref 6.5–8.1)

## 2024-04-28 LAB — IMMATURE PLATELET FRACTION: Immature Platelet Fraction: 2.7 % (ref 1.2–8.6)

## 2024-04-28 NOTE — Telephone Encounter (Signed)
 Left vm to return call.

## 2024-05-04 ENCOUNTER — Ambulatory Visit

## 2024-05-04 NOTE — Progress Notes (Signed)
 HEMATOLOGY/ONCOLOGY CLINIC NOTE  Date of Service: .04/28/2024 Patient Care Team: Patient, No Pcp Per as PCP - General (General Practice) Onesimo Emaline Brink, MD as Consulting Physician (Hematology) Dr. Pat Ruth - [facility]  CHIEF COMPLAINTS/PURPOSE OF CONSULTATION:  Follow-up for ITP Preop evaluation for dental extractions HISTORY OF PRESENTING ILLNESS:   Latoya Ayers is a wonderful 48 y.o. 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   Patient has a history of chronic ITP. She was diagnosed in 2008 and responded to steroids but relapsed with a taper. 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  Patient relapsed for the first time in late 2017 with platelets down to the 30k range and responded with improvement in platelet counts to more than 200k with dexamethasone. She has apparently had multiple treatments with dexamethasone in the last few years without durable response.  She underwent treatment with 4 doses of Rituxan from 3/18 through 12/07/2018 and was in remission for her chronic ITP till recently.  Patient presented to the emergency room at [facility] on 10/06/2020 as a transfer from [place] with 2 days of heavy menstrual vaginal bleeding, headaches, diffuse body aches,  fevers, night sweats and petechiae in the skin and oropharynx.  She was diagnosed with Covid infection in an unvaccinated situation.  Her platelet counts were noted to be 0. She was treated with Lysteda and dexamethasone 40 mg p.o. daily for 4 days. Her platelet counts after steroid dose levels were noted to be more than 100k. She did receive 1 unit of platelets. She did not receive IVIG. Hemoglobin levels were stable at 12.5.  Her chronic ITP relapse was thought to be related to active Covid infection. She did not receive any specific Covid related  therapeutics. She did have a CT of the head which showed no intracranial bleeding.  She has self-referred to be seen by us  for continued management of her chronic ITP. She notes she was very anxious since her platelets dropped to 0 and there was concern for significant bleeding.  She notes that in the last few days she has been taking 20 mg of dexamethasone daily because she was anxious her platelets might have dropped again.  On review of systems, pt reports no further fevers chills or night sweats. No shortness of breath. Resolution of all her Covid symptoms. No new bleeding or bruising. No current vaginal bleeding. No epistaxis hemoptysis hematemesis or GI bleeding. No current headaches.  INTERVAL HISTORY  Latoya Ayers is a 48 y.o. female who is here for follow-up of her chronic ITP. She is status post splenectomy and has normal platelets at baseline. She does have a tendency to drop her platelets with acute infections. Patient notes that she has been scheduled to have multiple dental extractions and is here to be cleared for surgery. Does not note any active bleeding issues at this time. Has been having significant anxiety related issues and was encouraged to follow-up with her primary care physician to optimize treatment for these. Labs done today were discussed with her in details  MEDICAL HISTORY:  Past Medical History:  Diagnosis Date   Acne 03/18/2011   Acute ITP (HCC)    Chest pain of uncertain etiology 03/19/2024   Claudication in peripheral vascular disease (HCC) 03/19/2024   Depression 01/08/2006   Dyspnea 03/15/2011   Gout  12/01/2006   Headache 11/03/2006   History of opioid abuse (HCC) 12/04/2017   Hypertension 03/19/2024   Hypothyroidism (acquired) 12/18/2010   Influenza 10/29/2018   Leukocytosis 03/15/2011   Palpitations 03/19/2024   Thyroid  cancer (HCC) 08/07/2016   Thyroid  nodule 01/14/2008   Ventricular tachycardia (HCC) 10/09/2006  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 Patient notes follicular adenoma removed in 2009. She has been on chronic levothyroxine replacement but her recent TSH was noted to be low at 0.068 and her levothyroxine dose was reduced to 150 mcg daily with a plan to follow-up with her primary care physician.  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 Notes that she is up-to-date with her postsplenectomy vaccinations. Chronic smoker.   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  SOCIAL HISTORY: Social History   Socioeconomic History   Marital status: Married    Spouse name: Not on file   Number of children: Not on file   Years of education: Not on file   Highest education level: Not on file  Occupational History   Not on file  Tobacco Use   Smoking status: Every Day    Current packs/day: 1.00    Types: Cigarettes   Smokeless tobacco: Never  Substance and Sexual Activity   Alcohol use: Yes    Comment: socially   Drug use: No   Sexual activity: Not on file  Other Topics Concern   Not on file  Social History Narrative   Not on file   Social Drivers of Health   Financial Resource Strain: Not on file  Food Insecurity: No Food Insecurity (12/29/2023)   Received from [facility]   Hunger Vital Sign    Within the past 12 months, you worried that your food would run out before you got the money to buy more.: Never true    Within the past 12 months, the food you bought just didn't last and you didn't have money to get more.: Never true  Transportation Needs: No Transportation Needs (12/29/2023)   Received from [facility]   PRAPARE - Transportation    Lack of Transportation (Medical): No    Lack of Transportation (Non-Medical): No  Physical Activity: Not on file  Stress: Stress Concern  Present (10/28/2018)   Received from [facility] - Occupational Stress Questionnaire    Feeling of Stress : Rather much  Social Connections: Not on file  Intimate Partner Violence: Not on file  smoking  vaping 20 puffs a day suboxone  FAMILY HISTORY:  Breast cancer -- family hx  ALLERGIES:  is allergic to penicillins.    MEDICATIONS:  Current Outpatient Medications  Medication Sig Dispense Refill   buprenorphine (SUBUTEX) 8 MG SUBL SL tablet Place 8 mg under the tongue 2 (two) times daily.     esomeprazole (NEXIUM) 20 MG capsule Take 20 mg by mouth as needed (heartburn).     hydrochlorothiazide  (MICROZIDE ) 12.5 MG capsule Take 1 capsule (12.5 mg total) by mouth daily. 90 capsule 3   hydrOXYzine (ATARAX) 25 MG tablet Take 25-50 mg by mouth 3 (three) times daily as needed.     ketoconazole (NIZORAL) 2 % cream Apply topically as needed for irritation.     levothyroxine (SYNTHROID) 150 MCG tablet Take 112 mcg by mouth.  Multiple Vitamin (MULTI-VITAMIN) tablet Take 1 tablet by mouth daily.     amLODipine  (NORVASC ) 2.5 MG tablet Take 1 tablet (2.5 mg total) by mouth daily. (Patient not taking: Reported on 04/28/2024) 90 tablet 3   No current facility-administered medications for this visit.    REVIEW OF SYSTEMS:   .10 Point review of Systems was done is negative except as noted above.   PHYSICAL EXAMINATION: ECOG PERFORMANCE STATUS: 1 - Symptomatic but completely ambulatory .BP (!) 137/93   Pulse 92   Temp 97.9 F (36.6 C)   Resp 20   Wt (!) 321 lb 12.8 oz (146 kg)   SpO2 99%   BMI 55.24 kg/m   GENERAL:alert, in no acute distress and comfortable SKIN: no acute rashes, no significant lesions EYES: conjunctiva are pink and non-injected, sclera anicteric OROPHARYNX: MMM, no exudates, no oropharyngeal erythema or ulceration NECK: supple, no JVD LYMPH:  no palpable lymphadenopathy in the cervical, axillary or inguinal  regions LUNGS: clear to auscultation b/l with normal respiratory effort HEART: regular rate & rhythm ABDOMEN:  normoactive bowel sounds , non tender, not distended. Extremity: no pedal edema PSYCH: alert & oriented x 3 with fluent speech NEURO: no focal motor/sensory deficits   LABORATORY DATA:  I have reviewed the data as listed  .    Latest Ref Rng & Units 04/28/2024   11:40 AM 02/20/2024   12:04 AM 02/09/2024   10:35 AM  CBC  WBC 4.0 - 10.5 K/uL 15.4  17.7  12.8   Hemoglobin 12.0 - 15.0 g/dL 86.4  87.0  87.2   Hematocrit 36.0 - 46.0 % 40.8  41.2  39.9   Platelets 150 - 400 K/uL 305  377  346     .    Latest Ref Rng & Units 04/28/2024   11:40 AM 03/19/2024    9:38 AM 02/20/2024   12:04 AM  CMP  Glucose 70 - 99 mg/dL 898  896  897   BUN 6 - 20 mg/dL 9  6  5    Creatinine 0.44 - 1.00 mg/dL 9.29  9.29  9.01   Sodium 135 - 145 mmol/L 136  142  134   Potassium 3.5 - 5.1 mmol/L 4.0  4.4  3.5   Chloride 98 - 111 mmol/L 102  102  98   CO2 22 - 32 mmol/L 30  23  27    Calcium 8.9 - 10.3 mg/dL 9.0  8.7  8.7   Total Protein 6.5 - 8.1 g/dL 7.3     Total Bilirubin 0.0 - 1.2 mg/dL 0.5     Alkaline Phos 38 - 126 U/L 81     AST 15 - 41 U/L 24     ALT 0 - 44 U/L 18      Component     Latest Ref Rng & Units 10/23/2020  Iron     41 - 142 ug/dL 84  TIBC     763 - 555 ug/dL 594  Saturation Ratios     21 - 57 % 21  UIBC     120 - 384 ug/dL 678  Preg, Serum     NEGATIVE NEGATIVE  Vitamin B12     180 - 914 pg/mL 646  T4,Free(Direct)     0.61 - 1.12 ng/dL 8.88  TSH     9.691 - 6.039 uIU/mL 0.233 (L)  Ferritin     11 - 307 ng/mL 42  Immature Platelet Fraction     1.2 - 8.6 %  1.4    RADIOGRAPHIC STUDIES: I have personally reviewed the radiological images as listed and agreed with the findings in the report.   01/06/2024 PVL Venous Duplex Lower Extremity Bilateral:     ASSESSMENT & PLAN:   48 year old female with history of chronic ITP with details as noted above with  1.  S/p Relapse of chronic ITP due to recent COVID-19 infection on 10/06/2020 with platelet counts of 0. She has had recent induction dose of dexamethasone 40 mg p.o. daily for 4 days. And herself decided to repeat dexamethasone 20 mg daily for additional 3 days until about 1 or 2 days ago.  Platelets today have bounced back to 757k. She has all been able to maintain durable response to steroids in the recent few years.   She was diagnosed in 2008 and responded to steroids but relapsed with a taper. 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  Patient relapsed for the first time in late 2017 with platelets down to the 30k range and responded with improvement in platelet counts to more than 200k with dexamethasone. She has apparently had multiple treatments with dexamethasone in the last few years without durable response.  She underwent treatment with 4 doses of Rituxan from 3/18 through 12/07/2018 and was in remission for her chronic ITP till recently.  PLAN: Patient's labs from today were discussed in detail CBC shows WBC count of 15.4k with hemoglobin of 13.5 and platelets of 305k CMP within normal limits Patient's platelets are within normal limits and are at baseline.  No contraindication to pursue dental extractions at this time from a chronic ITP standpoint. Her WBC counts are chronically elevated due to her postsplenectomy status.  No clinical evidence of infection at this time.  Could have some periodontal inflammation from her dental issues. - Patient insist on having repeat CBC the day before her dental extractions and this has been ordered. Avoid NSAIDs or other medications that can lower platelet counts. Antibiotic prophylaxis per dental medicine.  2. History of B12 deficiency B12 level today is at 646 PLan -Continue B12 1000 mcg p.o. daily  4. Hypothyroidism-status post thyroidectomy in 2009 for follicular adenoma. - Continue thyroid   replacement  5. History of opiate abuse currently on chronic opiate agonist therapy with Suboxone being managed by her primary care physician.  6.. H/o Hidradenitis suppurative   FOLLOW UP: Labs in about 1 week RTC with dr Onesimo with labs in 4 months   The total time spent in the appointment was 25 minutes*.  All of the patient's questions were answered with apparent satisfaction. The patient knows to call the clinic with any problems, questions or concerns.   Emaline Onesimo MD MS AAHIVMS [facility] Hematology/Oncology Physician [facility]  .*Total Encounter Time as defined by the Centers for Medicare and Medicaid Services includes, in addition to the face-to-face time of a patient visit (documented in the note above) non-face-to-face time: obtaining and reviewing outside history, ordering and reviewing medications, tests or procedures, care coordination (communications with other health care professionals or caregivers) and documentation in the medical record.

## 2024-05-06 ENCOUNTER — Other Ambulatory Visit: Payer: Self-pay

## 2024-05-06 DIAGNOSIS — R300 Dysuria: Secondary | ICD-10-CM | POA: Diagnosis not present

## 2024-05-06 DIAGNOSIS — N390 Urinary tract infection, site not specified: Secondary | ICD-10-CM | POA: Diagnosis not present

## 2024-05-06 DIAGNOSIS — R3915 Urgency of urination: Secondary | ICD-10-CM | POA: Diagnosis not present

## 2024-05-06 DIAGNOSIS — D693 Immune thrombocytopenic purpura: Secondary | ICD-10-CM

## 2024-05-06 DIAGNOSIS — R350 Frequency of micturition: Secondary | ICD-10-CM | POA: Diagnosis not present

## 2024-05-07 ENCOUNTER — Inpatient Hospital Stay: Attending: Hematology

## 2024-05-07 DIAGNOSIS — D693 Immune thrombocytopenic purpura: Secondary | ICD-10-CM | POA: Diagnosis not present

## 2024-05-07 LAB — CBC WITH DIFFERENTIAL (CANCER CENTER ONLY)
Abs Immature Granulocytes: 0.04 K/uL (ref 0.00–0.07)
Basophils Absolute: 0.1 K/uL (ref 0.0–0.1)
Basophils Relative: 1 %
Eosinophils Absolute: 0.4 K/uL (ref 0.0–0.5)
Eosinophils Relative: 3 %
HCT: 41.3 % (ref 36.0–46.0)
Hemoglobin: 13.1 g/dL (ref 12.0–15.0)
Immature Granulocytes: 0 %
Lymphocytes Relative: 36 %
Lymphs Abs: 4.8 K/uL — ABNORMAL HIGH (ref 0.7–4.0)
MCH: 26.9 pg (ref 26.0–34.0)
MCHC: 31.7 g/dL (ref 30.0–36.0)
MCV: 84.8 fL (ref 80.0–100.0)
Monocytes Absolute: 0.9 K/uL (ref 0.1–1.0)
Monocytes Relative: 7 %
Neutro Abs: 6.9 K/uL (ref 1.7–7.7)
Neutrophils Relative %: 53 %
Platelet Count: 326 K/uL (ref 150–400)
RBC: 4.87 MIL/uL (ref 3.87–5.11)
RDW: 15.8 % — ABNORMAL HIGH (ref 11.5–15.5)
WBC Count: 13.2 K/uL — ABNORMAL HIGH (ref 4.0–10.5)
nRBC: 0 % (ref 0.0–0.2)

## 2024-05-07 LAB — CMP (CANCER CENTER ONLY)
ALT: 15 U/L (ref 0–44)
AST: 18 U/L (ref 15–41)
Albumin: 3.7 g/dL (ref 3.5–5.0)
Alkaline Phosphatase: 78 U/L (ref 38–126)
Anion gap: 4 — ABNORMAL LOW (ref 5–15)
BUN: 6 mg/dL (ref 6–20)
CO2: 31 mmol/L (ref 22–32)
Calcium: 8.9 mg/dL (ref 8.9–10.3)
Chloride: 104 mmol/L (ref 98–111)
Creatinine: 0.67 mg/dL (ref 0.44–1.00)
GFR, Estimated: >60 mL/min (ref >60)
Glucose, Bld: 87 mg/dL (ref 70–99)
Potassium: 4.1 mmol/L (ref 3.5–5.1)
Sodium: 139 mmol/L (ref 135–145)
Total Bilirubin: 0.4 mg/dL (ref 0.0–1.2)
Total Protein: 7.4 g/dL (ref 6.5–8.1)

## 2024-05-07 LAB — IMMATURE PLATELET FRACTION: Immature Platelet Fraction: 2.7 % (ref 1.2–8.6)

## 2024-05-19 ENCOUNTER — Other Ambulatory Visit: Payer: Self-pay

## 2024-05-20 ENCOUNTER — Ambulatory Visit

## 2024-05-25 DIAGNOSIS — Z6841 Body Mass Index (BMI) 40.0 and over, adult: Secondary | ICD-10-CM | POA: Diagnosis not present

## 2024-05-25 DIAGNOSIS — F330 Major depressive disorder, recurrent, mild: Secondary | ICD-10-CM | POA: Diagnosis not present

## 2024-05-25 DIAGNOSIS — F1120 Opioid dependence, uncomplicated: Secondary | ICD-10-CM | POA: Diagnosis not present

## 2024-05-25 DIAGNOSIS — Z79899 Other long term (current) drug therapy: Secondary | ICD-10-CM | POA: Diagnosis not present

## 2024-05-25 DIAGNOSIS — F411 Generalized anxiety disorder: Secondary | ICD-10-CM | POA: Diagnosis not present

## 2024-06-02 ENCOUNTER — Emergency Department (HOSPITAL_COMMUNITY)

## 2024-06-02 ENCOUNTER — Other Ambulatory Visit: Payer: Self-pay

## 2024-06-02 ENCOUNTER — Encounter (HOSPITAL_COMMUNITY): Payer: Self-pay

## 2024-06-02 ENCOUNTER — Emergency Department (HOSPITAL_COMMUNITY)
Admission: EM | Admit: 2024-06-02 | Discharge: 2024-06-02 | Disposition: A | Discharge status: Home / Self Care | Attending: Emergency Medicine | Admitting: Emergency Medicine

## 2024-06-02 DIAGNOSIS — N12 Tubulo-interstitial nephritis, not specified as acute or chronic: Secondary | ICD-10-CM | POA: Diagnosis not present

## 2024-06-02 DIAGNOSIS — R1032 Left lower quadrant pain: Secondary | ICD-10-CM | POA: Insufficient documentation

## 2024-06-02 DIAGNOSIS — I10 Essential (primary) hypertension: Secondary | ICD-10-CM | POA: Diagnosis not present

## 2024-06-02 DIAGNOSIS — Z9081 Acquired absence of spleen: Secondary | ICD-10-CM | POA: Diagnosis not present

## 2024-06-02 DIAGNOSIS — R10A2 Flank pain, left side: Secondary | ICD-10-CM

## 2024-06-02 DIAGNOSIS — Z9049 Acquired absence of other specified parts of digestive tract: Secondary | ICD-10-CM | POA: Diagnosis not present

## 2024-06-02 DIAGNOSIS — R110 Nausea: Secondary | ICD-10-CM | POA: Diagnosis not present

## 2024-06-02 DIAGNOSIS — Z79899 Other long term (current) drug therapy: Secondary | ICD-10-CM | POA: Insufficient documentation

## 2024-06-02 DIAGNOSIS — D72829 Elevated white blood cell count, unspecified: Secondary | ICD-10-CM | POA: Diagnosis not present

## 2024-06-02 LAB — CBC
HCT: 46.1 % — ABNORMAL HIGH (ref 36.0–46.0)
Hemoglobin: 14.1 g/dL (ref 12.0–15.0)
MCH: 26.6 pg (ref 26.0–34.0)
MCHC: 30.6 g/dL (ref 30.0–36.0)
MCV: 87 fL (ref 80.0–100.0)
Platelets: 363 K/uL (ref 150–400)
RBC: 5.3 MIL/uL — ABNORMAL HIGH (ref 3.87–5.11)
RDW: 16 % — ABNORMAL HIGH (ref 11.5–15.5)
WBC: 13.4 K/uL — ABNORMAL HIGH (ref 4.0–10.5)
nRBC: 0 % (ref 0.0–0.2)

## 2024-06-02 LAB — URINALYSIS, ROUTINE W REFLEX MICROSCOPIC
Bacteria, UA: NONE SEEN
Bilirubin Urine: NEGATIVE
Glucose, UA: NEGATIVE mg/dL
Ketones, ur: NEGATIVE mg/dL
Nitrite: NEGATIVE
Protein, ur: NEGATIVE mg/dL
Specific Gravity, Urine: 1.014 (ref 1.005–1.030)
pH: 6 (ref 5.0–8.0)

## 2024-06-02 LAB — BASIC METABOLIC PANEL WITH GFR
Anion gap: 11 (ref 5–15)
BUN: 9 mg/dL (ref 6–20)
CO2: 28 mmol/L (ref 22–32)
Calcium: 9.2 mg/dL (ref 8.9–10.3)
Chloride: 102 mmol/L (ref 98–111)
Creatinine, Ser: 0.75 mg/dL (ref 0.44–1.00)
GFR, Estimated: >60 mL/min (ref >60)
Glucose, Bld: 84 mg/dL (ref 70–99)
Potassium: 4 mmol/L (ref 3.5–5.1)
Sodium: 140 mmol/L (ref 135–145)

## 2024-06-02 LAB — HCG, SERUM, QUALITATIVE: Preg, Serum: NEGATIVE

## 2024-06-02 MED ORDER — LIDOCAINE 5 % EX PTCH
1.0000 | MEDICATED_PATCH | CUTANEOUS | Status: DC
  Administered 2024-06-02: 1 via TRANSDERMAL
  Filled 2024-06-02: qty 1

## 2024-06-02 MED ORDER — OXYCODONE-ACETAMINOPHEN 5-325 MG PO TABS: 1.0000 | ORAL_TABLET | Freq: Four times a day (QID) | ORAL | 0 refills | Status: AC | PRN

## 2024-06-02 MED ORDER — CYCLOBENZAPRINE HCL 10 MG PO TABS
5.0000 mg | ORAL_TABLET | Freq: Once | ORAL | Status: DC
  Filled 2024-06-02: qty 1

## 2024-06-02 MED ORDER — ONDANSETRON HCL 4 MG/2ML IJ SOLN
4.0000 mg | Freq: Once | INTRAMUSCULAR | Status: AC
  Administered 2024-06-02: 4 mg via INTRAVENOUS
  Filled 2024-06-02: qty 2

## 2024-06-02 MED ORDER — ONDANSETRON 4 MG PO TBDP: 4.0000 mg | ORAL_TABLET | Freq: Three times a day (TID) | ORAL | 0 refills | Status: AC | PRN

## 2024-06-02 MED ORDER — FENTANYL CITRATE PF 50 MCG/ML IJ SOSY
50.0000 ug | PREFILLED_SYRINGE | Freq: Once | INTRAMUSCULAR | Status: AC
  Administered 2024-06-02: 50 ug via INTRAVENOUS
  Filled 2024-06-02: qty 1

## 2024-06-02 MED ORDER — OXYCODONE-ACETAMINOPHEN 5-325 MG PO TABS
1.0000 | ORAL_TABLET | Freq: Once | ORAL | Status: DC
  Filled 2024-06-02: qty 1

## 2024-06-02 NOTE — ED Triage Notes (Signed)
 Pt presents to ED from home C/O L flank pain with radiation to LLQ. Endorses nausea, denies vomiting, fever, chills.

## 2024-06-02 NOTE — ED Provider Notes (Signed)
 [place] EMERGENCY DEPARTMENT AT [facility] Provider Note   CSN: [id number] Arrival date & time: 06/02/24  9055     Patient presents with: Flank Pain   Latoya Ayers is a 48 y.o. female.    Flank Pain     48 year old female with medical history significant for obesity, ITP, HTN, ventricular tachycardia, presenting to the emergency department with chief complaint of left-sided flank pain for the past 2 days.  Patient states that she has had sharp pain in her left flank, worse with twisting and movement, no radiation down the legs.  She denies any fevers or chills.  She has had nausea, denies any vomiting.  Pain in the left flank radiates to her left lower quadrant of the abdomen.  She denies any dysuria or frequency, no hematuria.  She is moving her bowels and passing gas.  Prior to Admission medications   Medication Sig Start Date End Date Taking? Authorizing Provider  buprenorphine (SUBUTEX) 8 MG SUBL SL tablet Place 8 mg under the tongue 2 (two) times daily. 03/09/24   [provider]  esomeprazole (NEXIUM) 20 MG capsule Take 20 mg by mouth as needed (heartburn). 02/25/24 02/24/25  [provider]  hydrochlorothiazide  (MICROZIDE ) 12.5 MG capsule Take 1 capsule (12.5 mg total) by mouth daily. 04/23/24   Madireddy, Sreedhar R, MD  hydrOXYzine (ATARAX) 25 MG tablet Take 25-50 mg by mouth 3 (three) times daily as needed. 02/23/24   [provider]  ketoconazole (NIZORAL) 2 % cream Apply topically as needed for irritation. 03/08/24   [provider]  levothyroxine (SYNTHROID) 150 MCG tablet Take 112 mcg by mouth. 10/09/20   [provider]  Multiple Vitamin (MULTI-VITAMIN) tablet Take 1 tablet by mouth daily.    [provider]    Allergies: Penicillins    Review of Systems  Genitourinary:  Positive for flank pain.  All other systems reviewed and are negative.   Updated Vital Signs BP (!) 162/88 (BP Location: Left Arm)    Pulse 89   Temp 98 F (36.7 C) (Oral)   Resp 18   LMP 05/28/2024 (Exact Date)   SpO2 97%   Physical Exam Vitals and nursing note reviewed.  Constitutional:      Appearance: She is well-developed. She is obese. She is not ill-appearing.  HENT:     Head: Normocephalic and atraumatic.  Eyes:     Conjunctiva/sclera: Conjunctivae normal.  Cardiovascular:     Rate and Rhythm: Normal rate and regular rhythm.     Pulses: Normal pulses.  Pulmonary:     Effort: Pulmonary effort is normal. No respiratory distress.     Breath sounds: Normal breath sounds.  Abdominal:     Palpations: Abdomen is soft.     Tenderness: There is no abdominal tenderness. There is left CVA tenderness. There is no right CVA tenderness.  Musculoskeletal:        General: No swelling.     Cervical back: Neck supple.  Skin:    General: Skin is warm and dry.     Capillary Refill: Capillary refill takes less than 2 seconds.  Neurological:     Mental Status: She is alert.  Psychiatric:        Mood and Affect: Mood normal.     (all labs ordered are listed, but only abnormal results are displayed) Labs Reviewed  URINALYSIS, ROUTINE W REFLEX MICROSCOPIC - Abnormal; Notable for the following components:      Result Value  APPearance HAZY (*)    Hgb urine dipstick LARGE (*)    Leukocytes,Ua LARGE (*)    All other components within normal limits  CBC - Abnormal; Notable for the following components:   WBC 13.4 (*)    RBC 5.30 (*)    HCT 46.1 (*)    RDW 16.0 (*)    All other components within normal limits  HCG, SERUM, QUALITATIVE  BASIC METABOLIC PANEL WITH GFR    EKG: None  Radiology: No results found.   Procedures   Medications Ordered in the ED - No data to display                                  Medical Decision Making Amount and/or Complexity of Data Reviewed Labs: ordered. Radiology: ordered.  Risk Prescription drug management.   48 year old female with medical history  significant for obesity, ITP, HTN, ventricular tachycardia, presenting to the emergency department with chief complaint of left-sided flank pain for the past 2 days.  Patient states that she has had sharp pain in her left flank, worse with twisting and movement, no radiation down the legs.  She denies any fevers or chills.  She has had nausea, denies any vomiting.  Pain in the left flank radiates to her left lower quadrant of the abdomen.  She denies any dysuria or frequency, no hematuria.  She is moving her bowels and passing gas.  On arrival, the patient was afebrile, not tachycardic or tachypneic, BP 162/88, saturating 97% on room air.  Physical exam revealed left-sided CVA tenderness,  left flank tenderness.  Differential diagnosis includes nephrolithiasis, UTI/pyelonephritis, diverticulitis, less likely bowel obstruction, less likely aortic dissection, Less likely ruptured AAA, considered musculoskeletal back pain.  Labs: CBC with leukocytosis 13.4, no anemia, urinalysis with large hemoglobin, large leukocytes present, no bacteria seen.  hCG negative, BMP unremarkable.  CT Stone:  IMPRESSION:  No acute abnormality seen in the abdomen or pelvis.    Reassessed the patient bedside.  She was feeling symptomatically improved and was tolerating p.o. intake.  She did provide additional information and stated that she had been taking Omnicef and is on day 4 of the medication.  She has been prescribed a 10-day course.  Suspect likely persistent symptoms of pyelonephritis for which she is actively being treated.  Will provide pain control and nausea control.  Initially had considered a recently passed stone however the patient has had onset of her menstrual cycle currently explaining the blood in her urine.  Suspect urinalysis to not be accurate as the patient is currently on oral antibiotics.  Will continue treatment for suspected pyelonephritis, overall reassuring workup, patient without red flag symptoms to  suggest cauda equina syndrome, and has no midline spinal tenderness, has had no recent falls or trauma, has tenderness directly over the left flank over the left kidney, had recently been treated for UTI and then subsequently with development of left flank pain suspicious for pyelonephritis.  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  Advised continued symptomatic treatment for suspected pyelonephritis, close follow-up  with a primary care provider and return to the emerged department for any severe worsening symptoms, stable for discharge.     Final diagnoses:  None    ED Discharge Orders     None          Jerrol Agent, MD 06/02/24 1408

## 2024-06-02 NOTE — Discharge Instructions (Addendum)
 Workup in the emergency department was overall reassuring however your symptoms are consistent with likely pyelonephritis.  Your CT scan was reassuring and did not show evidence of a kidney stone or other acute emergent condition. Your urinalysis was not as consistent with UTI however you have been self administering antibiotics at home and suspect you are currently treating your infection, recommend you continue a full 10-day course of the medication.  Zofran has been prescribed for nausea and Percocet has been prescribed for breakthrough pain.

## 2024-06-03 LAB — URINE CULTURE: Culture: <10000 — AB

## 2024-06-05 ENCOUNTER — Other Ambulatory Visit: Payer: Self-pay

## 2024-06-05 ENCOUNTER — Emergency Department (HOSPITAL_COMMUNITY)

## 2024-06-05 ENCOUNTER — Emergency Department (HOSPITAL_COMMUNITY): Admission: EM | Admit: 2024-06-05 | Discharge: 2024-06-05 | Disposition: A | Discharge status: Home / Self Care

## 2024-06-05 ENCOUNTER — Encounter (HOSPITAL_COMMUNITY): Payer: Self-pay | Admitting: Emergency Medicine

## 2024-06-05 DIAGNOSIS — Z79899 Other long term (current) drug therapy: Secondary | ICD-10-CM | POA: Insufficient documentation

## 2024-06-05 DIAGNOSIS — I10 Essential (primary) hypertension: Secondary | ICD-10-CM | POA: Insufficient documentation

## 2024-06-05 DIAGNOSIS — D72829 Elevated white blood cell count, unspecified: Secondary | ICD-10-CM | POA: Diagnosis not present

## 2024-06-05 DIAGNOSIS — R1012 Left upper quadrant pain: Secondary | ICD-10-CM | POA: Insufficient documentation

## 2024-06-05 DIAGNOSIS — R109 Unspecified abdominal pain: Secondary | ICD-10-CM | POA: Diagnosis not present

## 2024-06-05 DIAGNOSIS — M62830 Muscle spasm of back: Secondary | ICD-10-CM | POA: Insufficient documentation

## 2024-06-05 DIAGNOSIS — M545 Low back pain, unspecified: Secondary | ICD-10-CM | POA: Diagnosis not present

## 2024-06-05 DIAGNOSIS — K429 Umbilical hernia without obstruction or gangrene: Secondary | ICD-10-CM | POA: Diagnosis not present

## 2024-06-05 DIAGNOSIS — R10A2 Flank pain, left side: Secondary | ICD-10-CM

## 2024-06-05 LAB — CBC WITH DIFFERENTIAL/PLATELET
Abs Immature Granulocytes: 0.06 K/uL (ref 0.00–0.07)
Basophils Absolute: 0.1 K/uL (ref 0.0–0.1)
Basophils Relative: 1 %
Eosinophils Absolute: 0.6 K/uL — ABNORMAL HIGH (ref 0.0–0.5)
Eosinophils Relative: 4 %
HCT: 46.6 % — ABNORMAL HIGH (ref 36.0–46.0)
Hemoglobin: 14.3 g/dL (ref 12.0–15.0)
Immature Granulocytes: 0 %
Lymphocytes Relative: 39 %
Lymphs Abs: 5.3 K/uL — ABNORMAL HIGH (ref 0.7–4.0)
MCH: 26.9 pg (ref 26.0–34.0)
MCHC: 30.7 g/dL (ref 30.0–36.0)
MCV: 87.8 fL (ref 80.0–100.0)
Monocytes Absolute: 0.9 K/uL (ref 0.1–1.0)
Monocytes Relative: 7 %
Neutro Abs: 6.7 K/uL (ref 1.7–7.7)
Neutrophils Relative %: 49 %
Platelets: 338 K/uL (ref 150–400)
RBC: 5.31 MIL/uL — ABNORMAL HIGH (ref 3.87–5.11)
RDW: 16.1 % — ABNORMAL HIGH (ref 11.5–15.5)
WBC: 13.6 K/uL — ABNORMAL HIGH (ref 4.0–10.5)
nRBC: 0 % (ref 0.0–0.2)

## 2024-06-05 LAB — COMPREHENSIVE METABOLIC PANEL WITH GFR
ALT: 9 U/L (ref 0–44)
AST: 25 U/L (ref 15–41)
Albumin: 4 g/dL (ref 3.5–5.0)
Alkaline Phosphatase: 107 U/L (ref 38–126)
Anion gap: 12 (ref 5–15)
BUN: 6 mg/dL (ref 6–20)
CO2: 26 mmol/L (ref 22–32)
Calcium: 9.3 mg/dL (ref 8.9–10.3)
Chloride: 101 mmol/L (ref 98–111)
Creatinine, Ser: 0.74 mg/dL (ref 0.44–1.00)
GFR, Estimated: >60 mL/min (ref >60)
Glucose, Bld: 86 mg/dL (ref 70–99)
Potassium: 4.1 mmol/L (ref 3.5–5.1)
Sodium: 139 mmol/L (ref 135–145)
Total Bilirubin: 0.3 mg/dL (ref 0.0–1.2)
Total Protein: 8 g/dL (ref 6.5–8.1)

## 2024-06-05 LAB — URINALYSIS, ROUTINE W REFLEX MICROSCOPIC
Bilirubin Urine: NEGATIVE
Bilirubin Urine: NEGATIVE
Glucose, UA: NEGATIVE mg/dL
Glucose, UA: NEGATIVE mg/dL
Hgb urine dipstick: NEGATIVE
Hgb urine dipstick: NEGATIVE
Ketones, ur: NEGATIVE mg/dL
Ketones, ur: NEGATIVE mg/dL
Leukocytes,Ua: NEGATIVE
Nitrite: NEGATIVE
Nitrite: NEGATIVE
Protein, ur: NEGATIVE mg/dL
Protein, ur: NEGATIVE mg/dL
Specific Gravity, Urine: 1.009 (ref 1.005–1.030)
Specific Gravity, Urine: 1.01 (ref 1.005–1.030)
pH: 6 (ref 5.0–8.0)
pH: 6 (ref 5.0–8.0)

## 2024-06-05 LAB — LIPASE, BLOOD: Lipase: 14 U/L (ref 11–51)

## 2024-06-05 LAB — HCG, SERUM, QUALITATIVE: Preg, Serum: NEGATIVE

## 2024-06-05 LAB — D-DIMER, QUANTITATIVE: D-Dimer, Quant: <0.27 ug{FEU}/mL (ref 0.00–0.50)

## 2024-06-05 MED ORDER — IOHEXOL 300 MG/ML  SOLN
100.0000 mL | Freq: Once | INTRAMUSCULAR | Status: AC | PRN
  Administered 2024-06-05: 100 mL via INTRAVENOUS

## 2024-06-05 MED ORDER — METHOCARBAMOL 500 MG PO TABS: 500.0000 mg | ORAL_TABLET | Freq: Two times a day (BID) | ORAL | 0 refills | Status: AC | PRN

## 2024-06-05 MED ORDER — LIDOCAINE 5 % EX PTCH
1.0000 | MEDICATED_PATCH | CUTANEOUS | Status: DC
  Administered 2024-06-05: 1 via TRANSDERMAL
  Filled 2024-06-05: qty 1

## 2024-06-05 MED ORDER — OXYCODONE-ACETAMINOPHEN 5-325 MG PO TABS
1.0000 | ORAL_TABLET | Freq: Once | ORAL | Status: AC
  Administered 2024-06-05: 1 via ORAL
  Filled 2024-06-05: qty 1

## 2024-06-05 MED ORDER — METHOCARBAMOL 500 MG PO TABS
500.0000 mg | ORAL_TABLET | Freq: Once | ORAL | Status: AC
  Administered 2024-06-05: 500 mg via ORAL
  Filled 2024-06-05: qty 1

## 2024-06-05 NOTE — ED Triage Notes (Signed)
 Pt with left flank pain and radiation across her back.  Pt reports she is still taking antibiotics.  Nausea.  No fever or vomiting.   Pt reports she is worsening instead of improving.

## 2024-06-05 NOTE — ED Provider Notes (Signed)
 [place] EMERGENCY DEPARTMENT AT [facility] Provider Note   CSN: [id number] Arrival date & time: 06/05/24  1045     Patient presents with: Flank Pain   Latoya Ayers is a 48 y.o. female.  {Add pertinent medical, surgical, social history, OB history to HPI:32947}  Flank Pain       Prior to Admission medications   Medication Sig Start Date End Date Taking? Authorizing Provider  buprenorphine (SUBUTEX) 8 MG SUBL SL tablet Place 8 mg under the tongue 2 (two) times daily. 03/09/24   [provider]  esomeprazole (NEXIUM) 20 MG capsule Take 20 mg by mouth as needed (heartburn). 02/25/24 02/24/25  [provider]  hydrochlorothiazide  (MICROZIDE ) 12.5 MG capsule Take 1 capsule (12.5 mg total) by mouth daily. 04/23/24   Madireddy, Sreedhar R, MD  hydrOXYzine (ATARAX) 25 MG tablet Take 25-50 mg by mouth 3 (three) times daily as needed. 02/23/24   [provider]  ketoconazole (NIZORAL) 2 % cream Apply topically as needed for irritation. 03/08/24   [provider]  levothyroxine (SYNTHROID) 150 MCG tablet Take 112 mcg by mouth. 10/09/20   [provider]  Multiple Vitamin (MULTI-VITAMIN) tablet Take 1 tablet by mouth daily.    [provider]  ondansetron (ZOFRAN-ODT) 4 MG disintegrating tablet Take 1 tablet (4 mg total) by mouth every 8 (eight) hours as needed for nausea or vomiting. 06/02/24   Jerrol Agent, MD  oxyCODONE-acetaminophen (PERCOCET/ROXICET) 5-325 MG tablet Take 1 tablet by mouth every 6 (six) hours as needed for severe pain (pain score 7-10). 06/02/24   Jerrol Agent, MD    Allergies: Penicillins    Review of Systems  Genitourinary:  Positive for flank pain.    Updated Vital Signs BP 115/66   Pulse 65   Temp 97.6 F (36.4 C) (Oral)   Resp 16   LMP 05/28/2024 (Exact Date)   SpO2 97%   Physical Exam  (all labs ordered are listed, but only abnormal results are displayed) Labs Reviewed  CBC WITH  DIFFERENTIAL/PLATELET - Abnormal; Notable for the following components:      Result Value   WBC 13.6 (*)    RBC 5.31 (*)    HCT 46.6 (*)    RDW 16.1 (*)    Lymphs Abs 5.3 (*)    Eosinophils Absolute 0.6 (*)    All other components within normal limits  URINALYSIS, ROUTINE W REFLEX MICROSCOPIC - Abnormal; Notable for the following components:   APPearance HAZY (*)    Leukocytes,Ua MODERATE (*)    Bacteria, UA RARE (*)    All other components within normal limits  CULTURE, BLOOD (SINGLE)  COMPREHENSIVE METABOLIC PANEL WITH GFR  HCG, SERUM, QUALITATIVE  D-DIMER, QUANTITATIVE    EKG: None  Radiology: No results found.  {Document cardiac monitor, telemetry assessment procedure when appropriate:32947} Procedures   Medications Ordered in the ED  lidocaine  (LIDODERM ) 5 % 1 patch (1 patch Transdermal Patch Applied 06/05/24 1416)  methocarbamol (ROBAXIN) tablet 500 mg (500 mg Oral Given 06/05/24 1338)  oxyCODONE-acetaminophen (PERCOCET/ROXICET) 5-325 MG per tablet 1 tablet (1 tablet Oral Given 06/05/24 1338)      {Click here for ABCD2, HEART and other calculators REFRESH Note before signing:1}                              Medical Decision Making Amount and/or Complexity of Data Reviewed Labs: ordered. Radiology: ordered.  Risk Prescription drug management.   ***  {  Document critical care time when appropriate  Document review of labs and clinical decision tools ie CHADS2VASC2, etc  Document your independent review of radiology images and any outside records  Document your discussion with family members, caretakers and with consultants  Document social determinants of health affecting pt's care  Document your decision making why or why not admission, treatments were needed:32947:::1}   Final diagnoses:  None    ED Discharge Orders     None

## 2024-06-05 NOTE — Discharge Instructions (Addendum)
 You were seen in the ER today for evaluation of your symptoms. I am glad that you are feeling better! You can follow up with you PCP about the CT imaging. For pain, I recommend taking 1000mg  of Tylenol every 6 hours as needed for pain. Do not take this much Tylenol with the previous narcotic pain medication you were given previously. Please consult your buprenorphine provider for pain management.  I recommend taking over-the-counter lidocaine  patches to the area as well.  I am prescribing you some muscle relaxers.  Please do not drive or operate machinery while on this medication as it will make you sleepy.  I have included some additional information for you to review.  If you have any concerns or any new or worsening symptoms, please return your nearest emergency department for evaluation.   Contact a health care provider if: You have pain that is not relieved with rest or medicine. You have increasing pain going down into your legs or buttocks. Your pain does not improve after 2 weeks. You have pain at night. You lose weight without trying. You have a fever or chills. You develop nausea or vomiting. You develop abdominal pain. Get help right away if: You develop new bowel or bladder control problems. You have unusual weakness or numbness in your arms or legs. You feel faint. These symptoms may represent a serious problem that is an emergency. Do not wait to see if the symptoms will go away. Get medical help right away. Call your local emergency services (911 in the [place] Do not drive yourself to the hospital.

## 2024-06-10 LAB — CULTURE, BLOOD (SINGLE)
Culture: NO GROWTH
Special Requests: ADEQUATE

## 2024-06-24 DIAGNOSIS — E559 Vitamin D deficiency, unspecified: Secondary | ICD-10-CM | POA: Diagnosis not present

## 2024-06-24 DIAGNOSIS — D6949 Other primary thrombocytopenia: Secondary | ICD-10-CM | POA: Diagnosis not present

## 2024-06-24 DIAGNOSIS — Z9889 Other specified postprocedural states: Secondary | ICD-10-CM | POA: Diagnosis not present

## 2024-06-29 ENCOUNTER — Encounter: Payer: Self-pay | Admitting: Internal Medicine

## 2024-07-01 ENCOUNTER — Emergency Department (HOSPITAL_COMMUNITY)
Admission: EM | Admit: 2024-07-01 | Discharge: 2024-07-01 | Discharge status: Against Medical Advice | Attending: Emergency Medicine | Admitting: Emergency Medicine

## 2024-07-01 ENCOUNTER — Other Ambulatory Visit: Payer: Self-pay

## 2024-07-01 DIAGNOSIS — R109 Unspecified abdominal pain: Secondary | ICD-10-CM | POA: Insufficient documentation

## 2024-07-01 DIAGNOSIS — R110 Nausea: Secondary | ICD-10-CM | POA: Insufficient documentation

## 2024-07-01 DIAGNOSIS — Z5321 Procedure and treatment not carried out due to patient leaving prior to being seen by health care provider: Secondary | ICD-10-CM | POA: Insufficient documentation

## 2024-07-01 NOTE — ED Notes (Signed)
Called and unable to locate in lobby 

## 2024-07-01 NOTE — ED Provider Triage Note (Signed)
 Emergency Medicine Provider Triage Evaluation Note  TOSHIKO KEMLER , a 48 y.o. female attempted to be evaluated in triage. Immediately upon entering the room, the patient reports No, I don't want to be seen by you and left the triage room independently with steady gait.    Bernis Ernst, [place] 07/01/24 9093

## 2024-07-01 NOTE — ED Triage Notes (Addendum)
 Pt. Stated, Jesus had left side to the back for over a month. I went to [facility] and did a scan  and did nothing. I called the GI Dr. Lenny and its still a month out. I can't stand this pain. Flinchun gave me an antibiotic and I started it on Sunday.

## 2024-07-06 ENCOUNTER — Other Ambulatory Visit: Payer: Self-pay

## 2024-07-06 DIAGNOSIS — E890 Postprocedural hypothyroidism: Secondary | ICD-10-CM | POA: Diagnosis not present

## 2024-07-06 MED ORDER — SYNTHROID 100 MCG PO TABS
100.0000 ug | ORAL_TABLET | Freq: Every morning | ORAL | 1 refills | Status: AC
  Filled 2024-07-06: qty 30, 30d supply, fill #0

## 2024-07-15 ENCOUNTER — Other Ambulatory Visit: Payer: Self-pay

## 2024-07-16 ENCOUNTER — Other Ambulatory Visit: Payer: Self-pay

## 2024-07-20 DIAGNOSIS — F411 Generalized anxiety disorder: Secondary | ICD-10-CM | POA: Diagnosis not present

## 2024-07-20 DIAGNOSIS — F330 Major depressive disorder, recurrent, mild: Secondary | ICD-10-CM | POA: Diagnosis not present

## 2024-07-22 ENCOUNTER — Ambulatory Visit

## 2024-08-04 DIAGNOSIS — E890 Postprocedural hypothyroidism: Secondary | ICD-10-CM | POA: Diagnosis not present

## 2024-08-09 ENCOUNTER — Other Ambulatory Visit
Admission: RE | Admit: 2024-08-09 | Discharge: 2024-08-09 | Disposition: A | Source: Ambulatory Visit | Attending: Internal Medicine | Admitting: Internal Medicine

## 2024-08-09 DIAGNOSIS — Z1389 Encounter for screening for other disorder: Secondary | ICD-10-CM | POA: Diagnosis not present

## 2024-08-09 DIAGNOSIS — D693 Immune thrombocytopenic purpura: Secondary | ICD-10-CM | POA: Diagnosis not present

## 2024-08-09 DIAGNOSIS — Z1331 Encounter for screening for depression: Secondary | ICD-10-CM | POA: Diagnosis not present

## 2024-08-09 DIAGNOSIS — Z131 Encounter for screening for diabetes mellitus: Secondary | ICD-10-CM | POA: Diagnosis not present

## 2024-08-09 DIAGNOSIS — G4733 Obstructive sleep apnea (adult) (pediatric): Secondary | ICD-10-CM | POA: Diagnosis not present

## 2024-08-09 DIAGNOSIS — Z0000 Encounter for general adult medical examination without abnormal findings: Secondary | ICD-10-CM | POA: Diagnosis not present

## 2024-08-09 DIAGNOSIS — E538 Deficiency of other specified B group vitamins: Secondary | ICD-10-CM | POA: Diagnosis not present

## 2024-08-09 DIAGNOSIS — E039 Hypothyroidism, unspecified: Secondary | ICD-10-CM | POA: Diagnosis not present

## 2024-08-09 LAB — CBC WITH DIFFERENTIAL/PLATELET
Abs Immature Granulocytes: 0.15 K/uL — ABNORMAL HIGH (ref 0.00–0.07)
Basophils Absolute: 0.1 K/uL (ref 0.0–0.1)
Basophils Relative: 1 %
Eosinophils Absolute: 0.5 K/uL (ref 0.0–0.5)
Eosinophils Relative: 3 %
HCT: 39.1 % (ref 36.0–46.0)
Hemoglobin: 12.5 g/dL (ref 12.0–15.0)
Immature Granulocytes: 1 %
Lymphocytes Relative: 34 %
Lymphs Abs: 6.4 K/uL — ABNORMAL HIGH (ref 0.7–4.0)
MCH: 27.8 pg (ref 26.0–34.0)
MCHC: 32 g/dL (ref 30.0–36.0)
MCV: 86.9 fL (ref 80.0–100.0)
Monocytes Absolute: 1.2 K/uL — ABNORMAL HIGH (ref 0.1–1.0)
Monocytes Relative: 6 %
Neutro Abs: 10.4 K/uL — ABNORMAL HIGH (ref 1.7–7.7)
Neutrophils Relative %: 55 %
Platelets: 222 K/uL (ref 150–400)
RBC: 4.5 MIL/uL (ref 3.87–5.11)
RDW: 15.6 % — ABNORMAL HIGH (ref 11.5–15.5)
WBC: 18.7 K/uL — ABNORMAL HIGH (ref 4.0–10.5)
nRBC: 0 % (ref 0.0–0.2)

## 2024-08-11 ENCOUNTER — Other Ambulatory Visit: Payer: Self-pay | Admitting: Internal Medicine

## 2024-08-11 DIAGNOSIS — R1084 Generalized abdominal pain: Secondary | ICD-10-CM

## 2024-08-11 DIAGNOSIS — K8689 Other specified diseases of pancreas: Secondary | ICD-10-CM

## 2024-08-12 ENCOUNTER — Other Ambulatory Visit: Payer: Self-pay

## 2024-08-12 MED ORDER — SUBOXONE 8-2 MG SL FILM
1.0000 | ORAL_FILM | Freq: Four times a day (QID) | SUBLINGUAL | 0 refills | Status: AC
  Filled 2024-08-12: qty 42, 11d supply, fill #0

## 2024-08-16 ENCOUNTER — Other Ambulatory Visit: Payer: Self-pay

## 2024-08-16 DIAGNOSIS — D693 Immune thrombocytopenic purpura: Secondary | ICD-10-CM

## 2024-08-17 ENCOUNTER — Inpatient Hospital Stay

## 2024-08-17 ENCOUNTER — Inpatient Hospital Stay: Attending: Hematology | Admitting: Hematology

## 2024-08-27 ENCOUNTER — Other Ambulatory Visit: Payer: Self-pay

## 2024-08-27 ENCOUNTER — Ambulatory Visit: Admitting: Internal Medicine

## 2024-08-27 ENCOUNTER — Encounter: Payer: Self-pay | Admitting: Internal Medicine

## 2024-08-27 VITALS — BP 136/80 | HR 73 | Ht 64.0 in | Wt 319.0 lb

## 2024-08-27 DIAGNOSIS — R194 Change in bowel habit: Secondary | ICD-10-CM

## 2024-08-27 DIAGNOSIS — Z1211 Encounter for screening for malignant neoplasm of colon: Secondary | ICD-10-CM

## 2024-08-27 DIAGNOSIS — R933 Abnormal findings on diagnostic imaging of other parts of digestive tract: Secondary | ICD-10-CM | POA: Diagnosis not present

## 2024-08-27 DIAGNOSIS — R109 Unspecified abdominal pain: Secondary | ICD-10-CM

## 2024-08-27 DIAGNOSIS — K219 Gastro-esophageal reflux disease without esophagitis: Secondary | ICD-10-CM | POA: Diagnosis not present

## 2024-08-27 DIAGNOSIS — R1310 Dysphagia, unspecified: Secondary | ICD-10-CM | POA: Diagnosis not present

## 2024-08-27 DIAGNOSIS — R1084 Generalized abdominal pain: Secondary | ICD-10-CM

## 2024-08-27 DIAGNOSIS — R935 Abnormal findings on diagnostic imaging of other abdominal regions, including retroperitoneum: Secondary | ICD-10-CM

## 2024-08-27 MED ORDER — PANTOPRAZOLE SODIUM 40 MG PO TBEC
40.0000 mg | DELAYED_RELEASE_TABLET | Freq: Every day | ORAL | 3 refills | Status: AC
  Filled 2024-08-27: qty 90, 90d supply, fill #0

## 2024-08-27 NOTE — Progress Notes (Signed)
 HISTORY OF PRESENT ILLNESS:  Latoya Ayers is a 48 y.o. 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  She is new to this practice.  No prior GI history or GI procedures.  First, the patient tells me that she has daily problems with heartburn and indigestion.  She takes Tums daily.  Occasional Nexium.  She reports vague dysphagia.  Next, she reports presenting to the emergency department June 05, 2024 regarding left abdominal and flank pain as well as nausea.  A CT scan of the abdomen and pelvis with contrast was performed.  She was said to have mild hypoenhancement of the pancreatic head with questionable associated edema.  No other abnormalities noted.  Blood work at that time was normal except for mildly elevated white count.  Normal lipase.  She is concerned about the CT scan finding.  No family history of pancreatic cancer.  She does describe some fasciculation in the upper abdomen after meals.  She tells me that she has had some food avoidance with mild weight loss.  Finally, she describes change in bowel habits.  She reports that her stools are not as formed as they have been normally and they are more odiferous.  Occasionally float.  She wonders what is going on.  She has not had colon cancer screening    REVIEW OF SYSTEMS:  All non-GI ROS negative unless otherwise stated in the HPI except for anxiety, headaches, visual change, muscle cramps, excessive urination  Past Medical History:  Diagnosis Date   Acne 03/18/2011   Acute ITP (HCC)    Chest pain of uncertain etiology 03/19/2024   Claudication in peripheral vascular disease 03/19/2024   Depression 01/08/2006   Dyspnea 03/15/2011   Gout 12/01/2006   Headache 11/03/2006   History of  opioid abuse (HCC) 12/04/2017   Hypertension 03/19/2024   Hypothyroidism (acquired) 12/18/2010   Influenza 10/29/2018   Leukocytosis 03/15/2011   Palpitations 03/19/2024   Thyroid  cancer (HCC) 08/07/2016   Thyroid  nodule 01/14/2008   Ventricular tachycardia (HCC) 10/09/2006    Past Surgical History:  Procedure Laterality Date   LEG SURGERY      Social History Latoya Ayers  reports that she has been smoking cigarettes. She has never used smokeless tobacco. She reports current alcohol use. She reports that she does not use drugs.  family history includes Breast cancer (age of onset: 80 - 49) in her maternal aunt; Breast cancer (age of onset: 11) in her mother.  Allergies[1]     PHYSICAL EXAMINATION: Vital signs: BP 136/80   Pulse 73   Ht 5' 4 (1.626 m)   Wt (!) 319 lb (144.7 kg)   BMI 54.76 kg/m   Constitutional: Morbidly obese but generally well-appearing, no acute distress Psychiatric: alert and oriented x3, cooperative Eyes: extraocular movements intact, anicteric, conjunctiva pink Mouth: oral pharynx moist, no lesions Neck: supple no lymphadenopathy Cardiovascular: heart regular rate and rhythm, no murmur Lungs: clear to auscultation bilaterally Abdomen: soft, obese, nontender, nondistended, no obvious ascites, no peritoneal signs, normal bowel sounds, no organomegaly Rectal: Deferred until colonoscopy Extremities: no clubbing, cyanosis, or lower extremity edema bilaterally Skin: no lesions on visible extremities Neuro: No focal deficits.  Cranial nerves intact  ASSESSMENT:  1.  Chronic GERD with mild dysphagia. 2.  Abnormal CT imaging  of the pancreas 3.  Abdominal pain and change in bowel habits as described. 4.  Colon cancer screening.  Average risk   PLAN:  1.  Reflux precautions 2.  Weight loss 3.  Prescribe pantoprazole  40 mg daily.  Medication risk reviewed 4.  MRI/MRCP of the pancreas to evaluate abnormality on CT imaging.  She will need open  MRI. 5.  Schedule upper endoscopy to evaluate chronic GERD, abdominal pain, and dysphagia.  She is high risk due to her BMI of greater than 50.  Her procedures will need to be performed at the hospital with monitored anesthesia care.The nature of the procedure, as well as the risks, benefits, and alternatives were carefully and thoroughly reviewed with the patient. Ample time for discussion and questions allowed. The patient understood, was satisfied, and agreed to proceed. 6.  Schedule colonoscopy to evaluate change in bowel habits and provide colon cancer screening.  She is high risk as above.The nature of the procedure, as well as the risks, benefits, and alternatives were carefully and thoroughly reviewed with the patient. Ample time for discussion and questions allowed. The patient understood, was satisfied, and agreed to proceed. 7.  Further recommendations or plans to be determined after the above Total time of 60 minutes was spent preparing to see the patient, reviewing multiple records, obtaining comprehensive history, performing medically appropriate physical examination, counseling and educating the patient regarding the above listed issues, ordering medication, ordering advanced radiology, ordering multiple endoscopic procedures, and documenting clinical information in the health record          [1]  Allergies Allergen Reactions   Penicillins Hives and Rash

## 2024-08-27 NOTE — Patient Instructions (Signed)
 You have been scheduled for an MRI/MRCP at [facility] on 09/03/2024. Your appointment time is 12:00pm. Please arrive to admitting (at main entrance of the hospital) 30 minutes prior to your appointment time for registration purposes. Please make certain not to have anything to eat or drink 4 hours prior to your test. In addition, if you have any metal in your body, have a pacemaker or defibrillator, please be sure to let your ordering physician know. This test typically takes 45 minutes to 1 hour to complete. Should you need to reschedule, please call [phone] to do so.  You have been scheduled for an endoscopy and colonoscopy. Please follow the written instructions given to you at your visit today.  If you use inhalers (even only as needed), please bring them with you on the day of your procedure.  DO NOT TAKE 7 DAYS PRIOR TO TEST- Trulicity (dulaglutide) Ozempic, Wegovy (semaglutide) Mounjaro, Zepbound (tirzepatide) Bydureon Bcise (exanatide extended release)  DO NOT TAKE 1 DAY PRIOR TO YOUR TEST Rybelsus (semaglutide) Adlyxin (lixisenatide) Victoza (liraglutide) Byetta (exanatide) ___________________________________________________________________________ _______________________________________________________  If your blood pressure at your visit was 140/90 or greater, please contact your primary care physician to follow up on this.  _______________________________________________________  If you are age 27 or older, your body mass index should be between 23-30. Your Body mass index is 54.76 kg/m. If this is out of the aforementioned range listed, please consider follow up with your Primary Care Provider.  If you are age 58 or younger, your body mass index should be between 19-25. Your Body mass index is 54.76 kg/m. If this is out of the aformentioned range listed, please consider follow up with your Primary Care Provider.    ________________________________________________________  The [facility] providers would like to encourage you to use MYCHART to communicate with providers for non-urgent requests or questions.  Due to long hold times on the telephone, sending your provider a message by [facility] may be a faster and more efficient way to get a response.  Please allow 48 business hours for a response.  Please remember that this is for non-urgent requests.  _______________________________________________________  [facility] is using a team-based approach to care.  Your team is made up of your doctor and two to three APPS. Our APPS (Nurse Practitioners and Physician Assistants) work with your physician to ensure care continuity for you. They are fully qualified to address your health concerns and develop a treatment plan. They communicate directly with your gastroenterologist to care for you. Seeing the Advanced Practice Practitioners on your physician's team can help you by facilitating care more promptly, often allowing for earlier appointments, access to diagnostic testing, procedures, and other specialty referrals.

## 2024-09-03 ENCOUNTER — Ambulatory Visit (HOSPITAL_COMMUNITY)

## 2024-09-10 ENCOUNTER — Other Ambulatory Visit: Payer: Self-pay

## 2024-09-14 ENCOUNTER — Other Ambulatory Visit: Payer: Self-pay

## 2024-09-14 MED ORDER — FLUTICASONE FUROATE-VILANTEROL 100-25 MCG/ACT IN AEPB
1.0000 | INHALATION_SPRAY | Freq: Every day | RESPIRATORY_TRACT | 11 refills | Status: AC
  Filled 2024-09-14: qty 60, 30d supply, fill #0

## 2024-09-14 MED ORDER — NYSTATIN 100000 UNIT/GM EX CREA
1.0000 | TOPICAL_CREAM | Freq: Two times a day (BID) | CUTANEOUS | 3 refills | Status: AC
  Filled 2024-09-14: qty 30, 30d supply, fill #0

## 2024-09-14 MED ORDER — FLUCONAZOLE 100 MG PO TABS
100.0000 mg | ORAL_TABLET | Freq: Every day | ORAL | 0 refills | Status: AC
  Filled 2024-09-14: qty 7, 7d supply, fill #0

## 2024-09-14 NOTE — Assessment & Plan Note (Signed)
 Likely will need tx, ? Zepbound first

## 2024-09-14 NOTE — Progress Notes (Signed)
 Latoya Ayers is a 49 y.o. female here for follow up of their medical problems  CHIEF COMPLAINT:  Follow up medical problems in the problem list and as discussed in the history and assessment areas as well as new complaints as listed.   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 Zepbound first   B12 deficiency Restarted b12.   Past Medical History:  Diagnosis Date   Hypothyroidism (acquired), unspecified    Thyroid  cancer (CMS-HCC)     Past Surgical History:  Procedure Laterality Date   THYROIDECTOMY TOTAL       No fever chills or sweats   No nausea, vomiting or diarrhea  No chest pain, shortness of breath   Social History   Socioeconomic History   Marital status: Married  Tobacco Use   Smoking status: Every Day   Smokeless tobacco: Never   Social Drivers of Corporate Investment Banker Strain: Low Risk  (08/09/2024)   Overall Financial Resource Strain (CARDIA)    Difficulty of Paying Living Expenses: Not hard at all  Food Insecurity: No Food Insecurity (08/09/2024)   Hunger Vital Sign    Worried About Running Out of Food in the Last Year: Never true    Ran Out of Food in the Last Year: Never true  Transportation Needs: No Transportation Needs (08/09/2024)   PRAPARE - Transportation    Lack of Transportation (Medical): No    Lack of  Transportation (Non-Medical): No  Stress: Stress Concern Present (10/28/2018)   Received from [facility] Occupational [facility] - Occupational Stress Questionnaire    Feeling of Stress : Rather much  Housing Stability: Low Risk  (08/09/2024)   Housing Stability Vital Sign    Unable to Pay for Housing in the Last Year: No    Number of Times Moved in the Last Year: 0    Homeless in the Last Year: No      Current Outpatient Medications:    levothyroxine  (SYNTHROID ) 100 MCG tablet, Take 100 mcg by mouth once daily Take on an empty stomach with a glass of water at least 30-60 minutes before breakfast., Disp: , Rfl:    multivitamin tablet, Take 1 tablet by mouth once daily., Disp: , Rfl:    SUBOXONE  8-2 mg SL film, Place 1 Film (8 mg total) under the tongue once daily, Disp: , Rfl:    fluconazole  (DIFLUCAN ) 100 MG tablet, Take 1 tablet (100 mg total) by mouth once daily for 7 days, Disp: 7 tablet, Rfl: 0   fluticasone  furoate-vilanteroL (BREO ELLIPTA ) 100-25 mcg/dose DsDv inhaler, Inhale 1 Puff into the lungs once daily, Disp: 60 each, Rfl: 11   nystatin  (MYCOSTATIN ) 100,000 unit/gram cream, Apply topically 2 (two) times daily, Disp: 30 g, Rfl: 3  Vitals:   09/14/24 1411  BP: (!) 142/93  Pulse: 74  Resp: 16   Body mass index is  53.42 kg/m. No acute distress Lungs; clear to ascultation Heart; Regular rate and rhythm  Abdomen; Soft and flat, normal bowel sounds Extremities; No clubbing, cyanosis or edema  Initial consult on 08/09/2024  Component Date Value Ref Range Status   Lipase 08/09/2024 5 (L)  11 - 82 U/L Final   Glucose 08/09/2024 93  70 - 110 mg/dL Final   Sodium 87/91/7974 136  136 - 145 mmol/L Final   Potassium 08/09/2024 4.2  3.6 - 5.1 mmol/L Final   Chloride 08/09/2024 98  97 - 109 mmol/L Final   Carbon Dioxide (CO2) 08/09/2024 33.2 (H)  22.0 - 32.0 mmol/L Final   Urea Nitrogen (BUN) 08/09/2024 9  7 - 25 mg/dL Final   Creatinine  87/91/7974 0.7  0.6 - 1.1 mg/dL Final   Glomerular Filtration Rate (eGFR) 08/09/2024 108  >60 mL/min/1.73sq m Final   Calcium 08/09/2024 9.0  8.7 - 10.3 mg/dL Final   AST  87/91/7974 15  8 - 39 U/L Final   ALT  08/09/2024 11  5 - 38 U/L Final   Alk Phos (alkaline Phosphatase) 08/09/2024 94  34 - 104 U/L Final   Albumin 08/09/2024 3.9  3.5 - 4.8 g/dL Final   Bilirubin, Total 08/09/2024 0.5  0.3 - 1.2 mg/dL Final   Protein, Total 08/09/2024 7.3  6.1 - 7.9 g/dL Final   A/G Ratio 87/91/7974 1.1  1.0 - 5.0 gm/dL Final   WBC (White Blood Cell Count) 08/09/2024 18.5 (H)  4.1 - 10.2 103/uL Final   RBC (Red Blood Cell Count) 08/09/2024 4.49  4.04 - 5.48 106/uL Final   Hemoglobin 08/09/2024 12.6  12.0 - 15.0 gm/dL Final   Hematocrit 87/91/7974 39.7  35.0 - 47.0 % Final   MCV (Mean Corpuscular Volume) 08/09/2024 88.4  80.0 - 100.0 fl Final   MCH (Mean Corpuscular Hemoglobin) 08/09/2024 28.1  27.0 - 31.2 pg Final   MCHC (Mean Corpuscular Hemoglobin * 08/09/2024 31.7 (L)  32.0 - 36.0 gm/dL Final   Platelet Count 08/09/2024 216  150 - 450 103/uL Final   RDW-CV (Red Cell Distribution Widt* 08/09/2024 15.5 (H)  11.6 - 14.8 % Final   MPV (Mean Platelet Volume) 08/09/2024 9.6  9.4 - 12.4 fl Final   Neutrophils 08/09/2024 10.26 (H)  1.50 - 7.80 103/uL Final   Lymphocytes 08/09/2024 6.46 (H)  1.00 - 3.60 103/uL Final   Monocytes 08/09/2024 1.11  0.00 - 1.50 103/uL Final   Eosinophils 08/09/2024 0.48  0.00 - 0.55 103/uL Final   Basophils 08/09/2024 0.10 (H)  0.00 - 0.09 103/uL Final   Neutrophil % 08/09/2024 55.4  32.0 - 70.0 % Final   Lymphocyte % 08/09/2024 34.8  10.0 - 50.0 % Final   Monocyte % 08/09/2024 6.0  4.0 - 13.0 % Final   Eosinophil % 08/09/2024 2.6  1.0 - 5.0 % Final   Basophil% 08/09/2024 0.5  0.0 - 2.0 % Final   Immature Granulocyte % 08/09/2024 0.7  <=0.7 % Final   Immature Granulocyte Count 08/09/2024 0.13 (H)  <=0.06 10^3/L Final   Hemoglobin A1C  08/09/2024 5.7 (H)  4.2 - 5.6 % Final   Average Blood Glucose (Calc) 08/09/2024 117  mg/dL Final   Thyroid  Stimulating Hormone (TSH) 08/09/2024 31.160 (H)  0.450-5.330 uIU/ml uIU/mL Final   Color 08/09/2024 Colorless  Colorless, Straw, Light Yellow, Yellow, Dark Yellow Final   Clarity 08/09/2024 Clear  Clear Final   Specific Gravity 08/09/2024 1.005  1.005 - 1.030 Final   pH, Urine 08/09/2024  6.5  5.0 - 8.0 Final   Protein, Urinalysis 08/09/2024 Negative  Negative mg/dL Final   Glucose, Urinalysis 08/09/2024 Negative  Negative mg/dL Final   Ketones, Urinalysis 08/09/2024 Negative  Negative mg/dL Final   Blood, Urinalysis 08/09/2024 Negative  Negative Final   Nitrite, Urinalysis 08/09/2024 Negative  Negative Final   Leukocyte Esterase, Urinalysis 08/09/2024 Negative  Negative Final   Bilirubin, Urinalysis 08/09/2024 Negative  Negative Final   Urobilinogen, Urinalysis 08/09/2024 0.2  0.2 - 1.0 mg/dL Final   WBC, UA 87/91/7974 1  <=5 /hpf Final   Red Blood Cells, Urinalysis 08/09/2024 0  <=3 /hpf Final   Bacteria, Urinalysis 08/09/2024 0-5  0 - 5 /hpf Final   Squamous Epithelial Cells, Urinaly* 08/09/2024 0  /hpf Final   Urine Culture, Routine - [facility] 08/09/2024 Final report   Final   Result 1 - [facility] 08/09/2024 Comment   Final   Amphetamines, Urine - [facility] 08/09/2024 Negative  Cutoff=1000 ng/mL Final   Cannabinoid Quant, Ur - [facility] 08/09/2024 Negative  Cutoff=50 ng/mL Final   Cocaine (Metab.) - [facility] 08/09/2024 Negative  Cutoff=300 ng/mL Final   Opiates - [facility] 08/09/2024 Negative  Cutoff=300 ng/mL Final   6-Acetylmorphine, Urine - [facility] 08/09/2024 Negative  Cutoff=10 ng/mL Final   Vitamin B12 08/09/2024 165 (L)  >300 pg/mL Final   See Scanned Result 08/09/2024 see scanned   Final   Segmented Neutrophil 08/09/2024 46  10 - 50 % Final   Band 08/09/2024 5  0 - 6 % Final   Lymphocyte 08/09/2024 37 (H)  8 - 10 % Final   Monocytes 08/09/2024 6  4 -  13 % Final   Eosinophil 08/09/2024 6 (H)  1 - 5 % Final   Basophils 08/09/2024 0  0 - 4 % Final    ASSESSMENT  AND PLAN:  Diagnoses and all orders for this visit:  Diarrhea, unspecified type -     C. difficile - [facility] -     C difficile Toxin Gene NAA - [facility]  B12 deficiency Assessment & Plan: Restarted b12.    Essential hypertension Assessment & Plan: Likely will need tx, ? Zepbound first    Hypothyroidism (acquired) Assessment & Plan: Tsh is improivng per pt, endo has her on brand name which she notes helps her   OSA (obstructive sleep apnea)  Other orders -     nystatin  (MYCOSTATIN ) 100,000 unit/gram cream; Apply topically 2 (two) times daily -     fluconazole  (DIFLUCAN ) 100 MG tablet; Take 1 tablet (100 mg total) by mouth once daily for 7 days -     fluticasone  furoate-vilanteroL (BREO ELLIPTA ) 100-25 mcg/dose DsDv inhaler; Inhale 1 Puff into the lungs once daily

## 2024-09-14 NOTE — Assessment & Plan Note (Signed)
 Restarted b12.

## 2024-09-14 NOTE — Assessment & Plan Note (Addendum)
 Tsh is improivng per pt, endo has her on brand name which she notes helps her

## 2024-09-14 NOTE — Assessment & Plan Note (Signed)
 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

## 2024-09-29 ENCOUNTER — Other Ambulatory Visit: Payer: Self-pay

## 2024-09-29 MED ORDER — SYNTHROID 125 MCG PO TABS
125.0000 ug | ORAL_TABLET | Freq: Every morning | ORAL | 1 refills | Status: AC
  Filled 2024-09-29: qty 30, 30d supply, fill #0

## 2024-09-30 ENCOUNTER — Encounter (HOSPITAL_COMMUNITY): Payer: Self-pay | Admitting: Internal Medicine

## 2024-09-30 NOTE — Progress Notes (Signed)
 Attempted to obtain medical history for pre op call via telephone, unable to reach at this time. HIPAA compliant voicemail message left requesting return call to pre surgical testing department.

## 2024-10-07 ENCOUNTER — Other Ambulatory Visit: Payer: Self-pay

## 2024-10-13 ENCOUNTER — Encounter (HOSPITAL_COMMUNITY): Admission: RE | Payer: Self-pay | Source: Home / Self Care

## 2024-10-13 ENCOUNTER — Ambulatory Visit (HOSPITAL_COMMUNITY): Admission: RE | Admit: 2024-10-13 | Source: Home / Self Care | Admitting: Internal Medicine

## 2024-10-13 SURGERY — EGD (ESOPHAGOGASTRODUODENOSCOPY)
Anesthesia: Monitor Anesthesia Care
